# Patient Record
Sex: Female | Born: 1950 | Race: Black or African American | Hispanic: No | Smoking: Never smoker
Health system: Southern US, Community
[De-identification: ages and names within clinical notes are randomized; demographics above are authoritative.]

## PROBLEM LIST (undated history)

## (undated) DIAGNOSIS — H409 Unspecified glaucoma: Secondary | ICD-10-CM

---

## 2006-04-15 ENCOUNTER — Ambulatory Visit: Payer: Self-pay | Admitting: Gastroenterology

## 2006-10-07 ENCOUNTER — Ambulatory Visit: Payer: Self-pay | Admitting: Internal Medicine

## 2006-12-03 ENCOUNTER — Ambulatory Visit: Payer: Self-pay | Admitting: Internal Medicine

## 2007-03-01 ENCOUNTER — Ambulatory Visit: Payer: Self-pay | Admitting: Internal Medicine

## 2008-03-11 IMAGING — US US EXTREM LOW VENOUS*R*
1 series · 17 of 24 positions shown · non-contrast
Comparison: none

REASON FOR EXAM: pain at touch right leg R/O DVT call Report  [id number]
COMMENTS:

PROCEDURE:     US  - US DOPPLER LOW EXTR RIGHT  - March 01, 2007  [DATE]
RESULT:     The phasic, augmentation and Valsalva flow waveforms are normal.
The RIGHT femoral and popliteal veins show normal compressibility. Doppler
examination shows no deep vein thrombosis or occlusion.

[Series 1: us extrem low venous*right* · 17 of 35 slices shown]
[im 1/35]
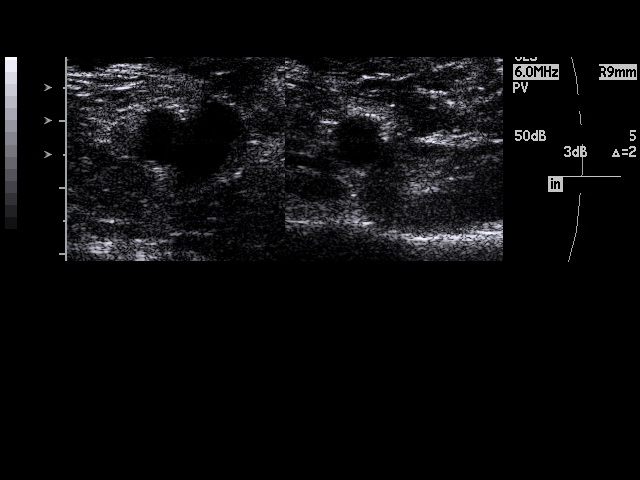
[im 3/35]
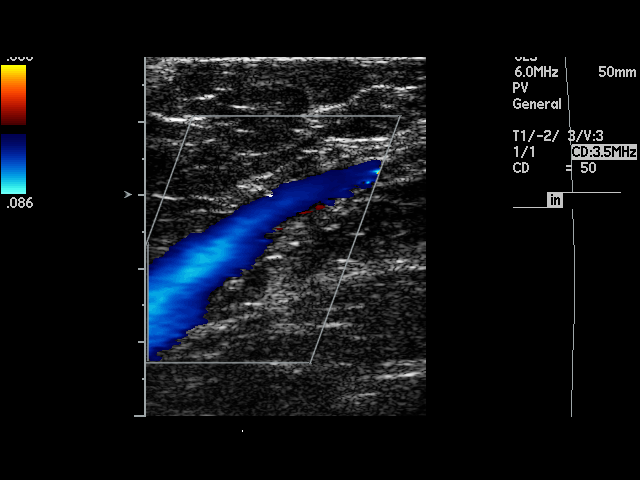
[im 5/35]
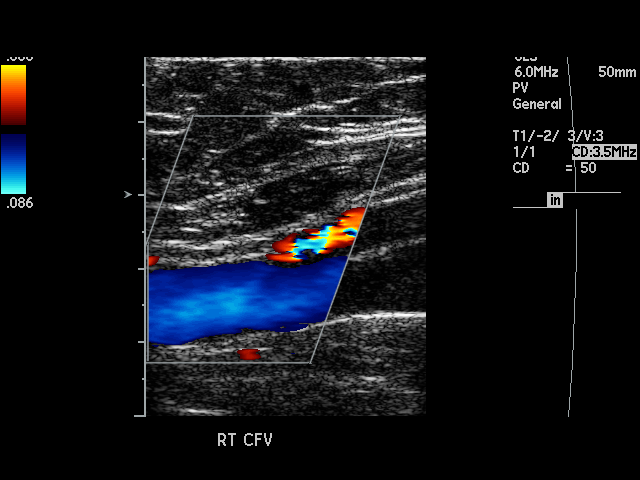
[im 6/35]
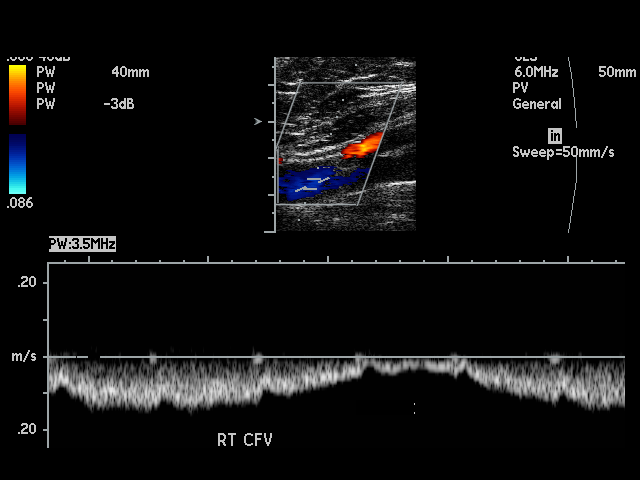
[im 9/35]
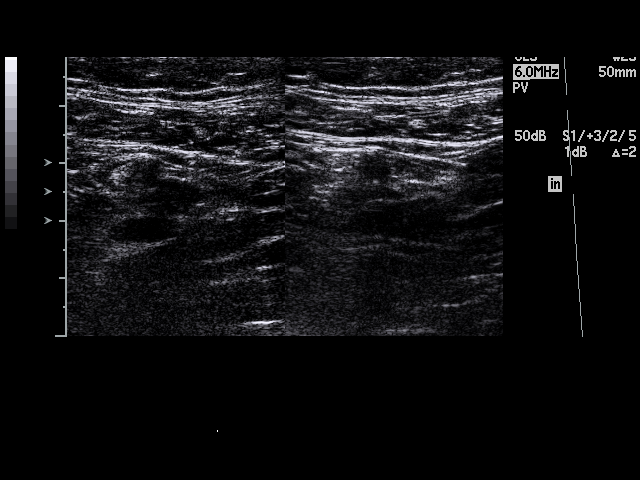
[im 11/35]
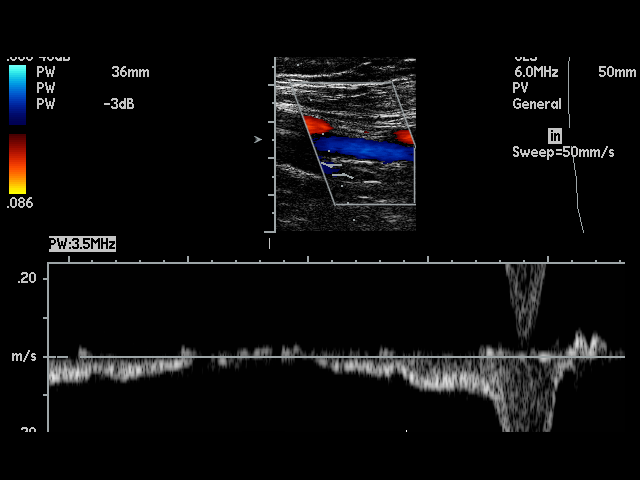
[im 14/35]
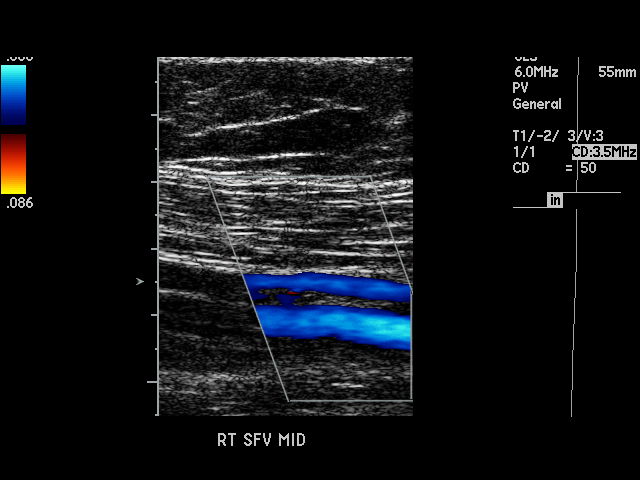
[im 15/35]
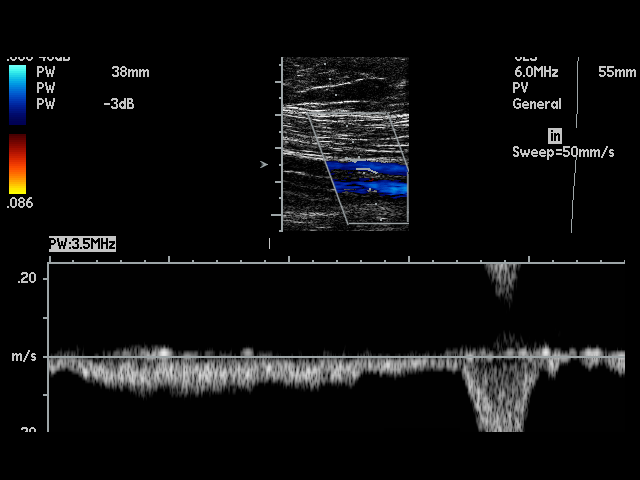
[im 18/35]
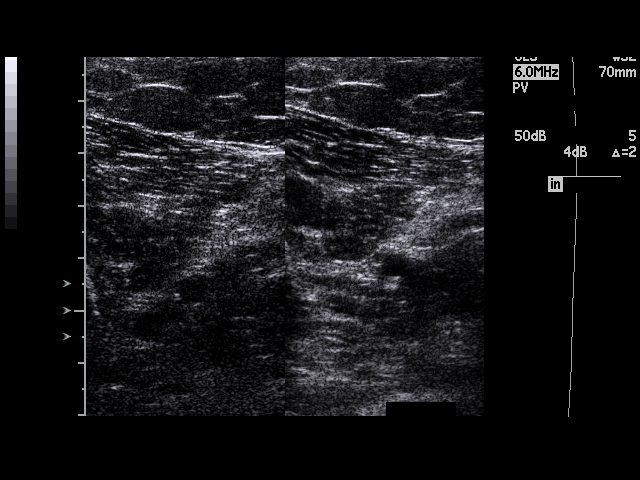
[im 20/35]
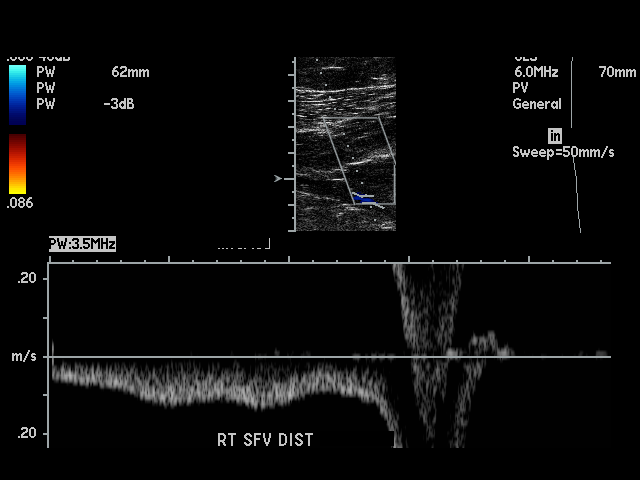
[im 21/35]
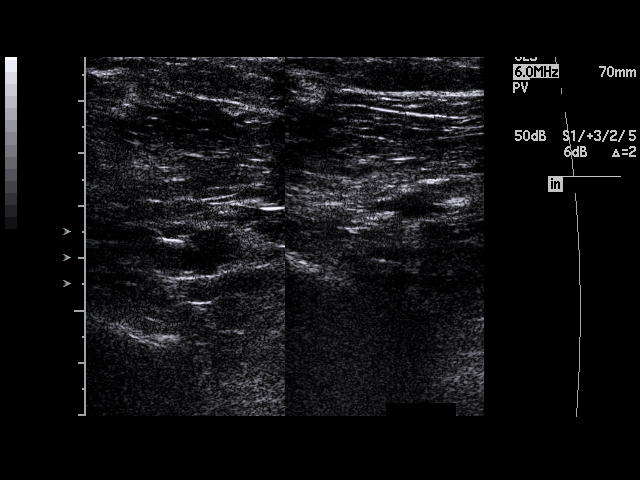
[im 24/35]
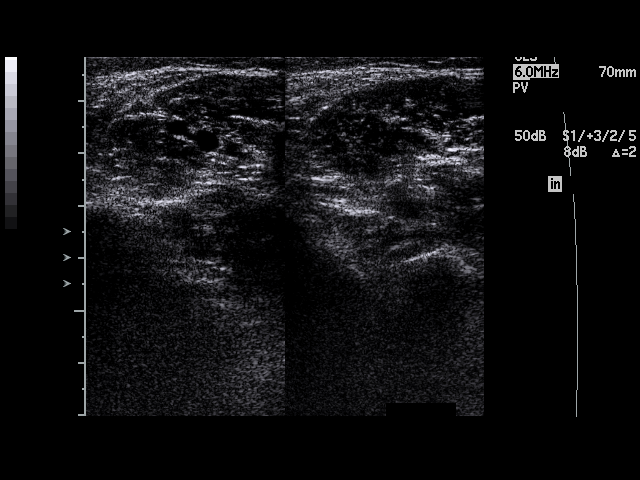
[im 26/35]
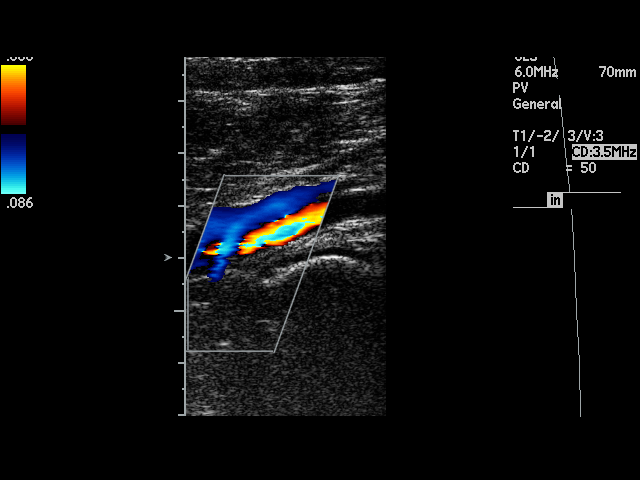
[im 29/35]
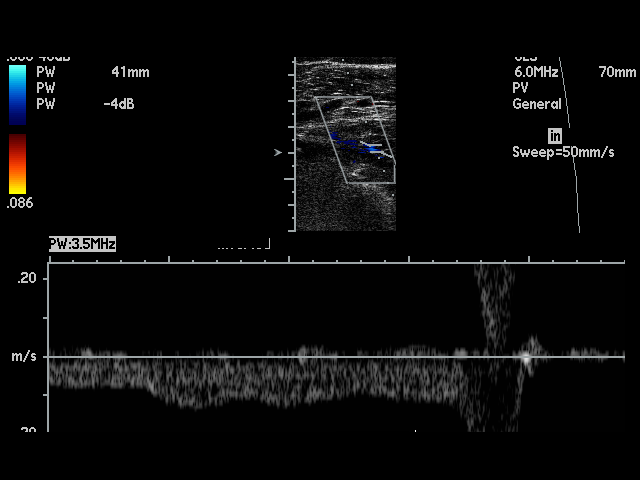
[im 30/35]
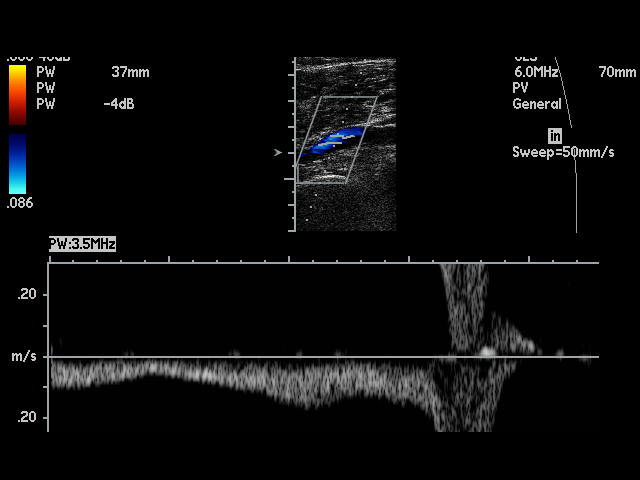
[im 32/35]
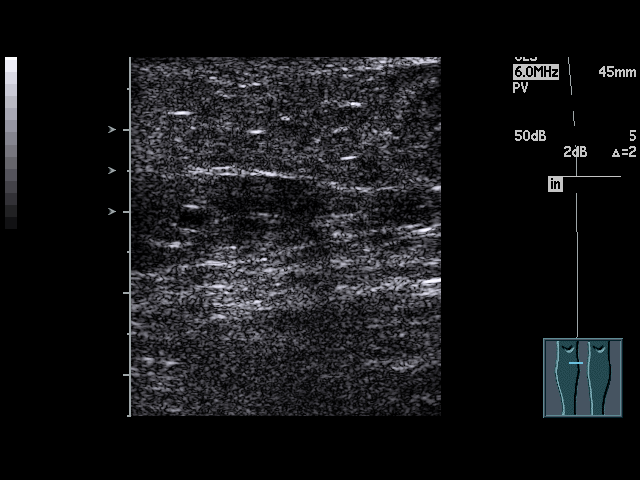
[im 35/35]
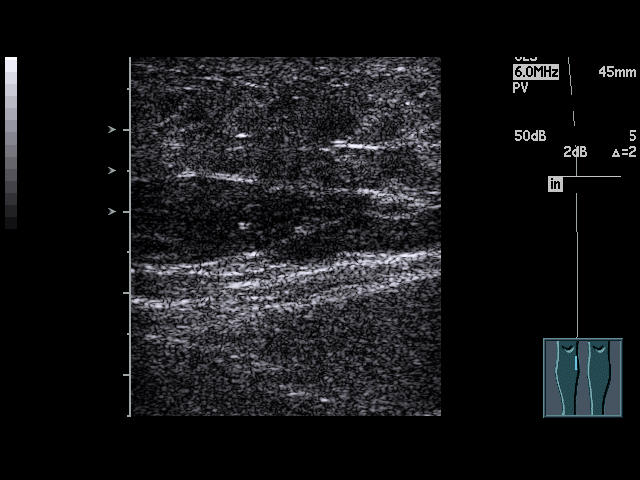

[17 of 24 positions shown; findings below may reference images not displayed]

IMPRESSION: 1.     Normal study. No deep vein thrombosis is identified in the RIGHT
lower extremity.

## 2009-08-22 ENCOUNTER — Ambulatory Visit: Payer: Self-pay | Admitting: Family Medicine

## 2011-05-08 ENCOUNTER — Emergency Department: Payer: Self-pay | Admitting: Emergency Medicine

## 2015-07-04 DIAGNOSIS — H524 Presbyopia: Secondary | ICD-10-CM | POA: Diagnosis not present

## 2018-01-23 ENCOUNTER — Ambulatory Visit
Admission: EM | Admit: 2018-01-23 | Discharge: 2018-01-23 | Disposition: A | Discharge status: Home / Self Care | Payer: Medicare Other | Attending: Family Medicine | Admitting: Family Medicine

## 2018-01-23 ENCOUNTER — Other Ambulatory Visit: Payer: Self-pay

## 2018-01-23 ENCOUNTER — Encounter: Payer: Self-pay | Admitting: Emergency Medicine

## 2018-01-23 DIAGNOSIS — L259 Unspecified contact dermatitis, unspecified cause: Secondary | ICD-10-CM | POA: Diagnosis not present

## 2018-01-23 DIAGNOSIS — R030 Elevated blood-pressure reading, without diagnosis of hypertension: Secondary | ICD-10-CM

## 2018-01-23 HISTORY — DX: Unspecified glaucoma: H40.9

## 2018-01-23 MED ORDER — TRIAMCINOLONE ACETONIDE 0.1 % EX CREA: 1.0000 "application " | TOPICAL_CREAM | Freq: Two times a day (BID) | CUTANEOUS | 0 refills | Status: DC

## 2018-01-23 MED ORDER — PREDNISONE 20 MG PO TABS: ORAL_TABLET | ORAL | 0 refills | Status: DC

## 2018-01-23 NOTE — ED Provider Notes (Addendum)
[facility]    CSN: [id number] Arrival date & time: 01/23/18  7829     History   Chief Complaint Chief Complaint  Patient presents with  . Rash    HPI Emily Hunt is a 67 y.o. female.   67 yo female with a c/o red, itchy rash around her neck for 2 weeks, since having her hair cut. States they used a razor on her neck as part of the hair cut. Patient denies any pain, fevers, chills, drainage. Only intense itching and has tried over the counter meds without relief.   The history is provided by the patient.  Rash    Past Medical History:  Diagnosis Date  . Glaucoma     There are no active problems to display for this patient.   History reviewed. No pertinent surgical history.  OB History   None      Home Medications    Prior to Admission medications   Medication Sig Start Date End Date Taking? Authorizing Provider  latanoprost (XALATAN) 0.005 % ophthalmic solution  12/29/17  Yes [provider]  Multiple Vitamin (MULTIVITAMIN) tablet Take 1 tablet by mouth daily.   Yes [provider]  timolol (TIMOPTIC) 0.5 % ophthalmic solution INT 1 GTT IN OU QD 12/29/17  Yes [provider]  predniSONE (DELTASONE) 20 MG tablet 2 tabs po qd x 3 days, then 1 tab po qd x 4 days 01/23/18   Payton Mccallum, MD  triamcinolone cream (KENALOG) 0.1 % Apply 1 application topically 2 (two) times daily. 01/23/18   Payton Mccallum, MD    Family History History reviewed. No pertinent family history.  Social History Social History   Tobacco Use  . Smoking status: Never Smoker  . Smokeless tobacco: Never Used  Substance Use Topics  . Alcohol use: Never    Frequency: Never  . Drug use: Not on file     Allergies   Sulfa antibiotics   Review of Systems Review of Systems  Skin: Positive for rash.     Physical Exam Triage Vital Signs ED Triage Vitals  Enc Vitals Group     BP 01/23/18 0838 (S) (!) 196/88     Pulse Rate 01/23/18 0838 65   Resp 01/23/18 0838 16     Temp 01/23/18 0838 98.7 F (37.1 C)     Temp Source 01/23/18 0838 Oral     SpO2 01/23/18 0838 99 %     Weight 01/23/18 0834 160 lb (72.6 kg)     Height 01/23/18 0834  (1.549 m)     Head Circumference --      Peak Flow --      Pain Score 01/23/18 0834 0     Pain Loc --      Pain Edu? --      Excl. in GC? --    No data found.  Updated Vital Signs BP (!) 160/90 (BP Location: Left Arm)   Pulse 65   Temp 98.7 F (37.1 C) (Oral)   Resp 16   Ht  (1.549 m)   Wt 160 lb (72.6 kg)   SpO2 99%   BMI 30.23 kg/m   Visual Acuity Right Eye Distance:   Left Eye Distance:   Bilateral Distance:    Right Eye Near:   Left Eye Near:    Bilateral Near:     Physical Exam  Constitutional: She appears well-developed and well-nourished. No distress.  Skin: Rash noted. Rash is maculopapular. She  is not diaphoretic. There is erythema.     Nursing note and vitals reviewed.    UC Treatments / Results  Labs (all labs ordered are listed, but only abnormal results are displayed) Labs Reviewed - No data to display  EKG None  Radiology No results found.  Procedures Procedures (including critical care time)  Medications Ordered in UC Medications - No data to display  Initial Impression / Assessment and Plan / UC Course  I have reviewed the triage vital signs and the nursing notes.  Pertinent labs & imaging results that were available during my care of the patient were reviewed by me and considered in my medical decision making (see chart for details).      Final Clinical Impressions(s) / UC Diagnoses   Final diagnoses:  Contact dermatitis, unspecified contact dermatitis type, unspecified trigger  Single episode of elevated blood pressure     Discharge Instructions     Claritin or Zyrtec one a day Benadryl if needed    ED Prescriptions    Medication Sig Dispense Auth. Provider   predniSONE (DELTASONE) 20 MG tablet 2 tabs po qd x 3  days, then 1 tab po qd x 4 days 10 tablet Mihika Surrette, MD   triamcinolone cream (KENALOG) 0.1 % Apply 1 application topically 2 (two) times daily. 30 g Payton Mccallum, MD      1. diagnosis reviewed with patient 2. rx as per orders above; reviewed possible side effects, interactions, risks and benefits  3. Recommend supportive treatment with otc antihistamines 4. Discussed with the patient the issue of elevated blood pressure and recommendation to follow up with PCP ASAP 5. Follow-up prn if symptoms worsen or don't improve  Controlled Substance Prescriptions [place] Controlled Substance Registry consulted? Not Applicable   Payton Mccallum, MD 01/23/18 4098    Payton Mccallum, MD 01/23/18 [id number]

## 2018-01-23 NOTE — ED Triage Notes (Signed)
Patient c/o red itchy rash around her neck that started couple of weeks ago.

## 2018-01-23 NOTE — Discharge Instructions (Addendum)
Claritin or Zyrtec one a day Benadryl if needed

## 2019-03-01 ENCOUNTER — Other Ambulatory Visit: Payer: Self-pay

## 2019-03-01 ENCOUNTER — Encounter: Payer: Self-pay | Admitting: Emergency Medicine

## 2019-03-01 ENCOUNTER — Ambulatory Visit
Admission: EM | Admit: 2019-03-01 | Discharge: 2019-03-01 | Disposition: A | Discharge status: Home / Self Care | Payer: Medicare Other | Attending: Family Medicine | Admitting: Family Medicine

## 2019-03-01 DIAGNOSIS — H938X3 Other specified disorders of ear, bilateral: Secondary | ICD-10-CM | POA: Diagnosis not present

## 2019-03-01 DIAGNOSIS — R0982 Postnasal drip: Secondary | ICD-10-CM | POA: Diagnosis not present

## 2019-03-01 MED ORDER — IPRATROPIUM BROMIDE 0.06 % NA SOLN: 2.0000 | Freq: Four times a day (QID) | NASAL | 0 refills | Status: DC | PRN

## 2019-03-01 MED ORDER — CETIRIZINE HCL 5 MG PO TABS: 5.0000 mg | ORAL_TABLET | Freq: Every day | ORAL | 1 refills | Status: DC

## 2019-03-01 NOTE — Discharge Instructions (Signed)
Medications as prescribed. ° °Take care ° °Dr. Nkosi Cortright  °

## 2019-03-01 NOTE — ED Provider Notes (Signed)
[facility]    CSN: [id number] Arrival date & time: 03/01/19  [id number]  History   Chief Complaint Chief Complaint  Patient presents with  . Nasal Congestion  . Ear Fullness   HPI  68 year old female presents with the above complaints.  Patient states that she has had symptoms for the past week.  She reports postnasal drip and ear fullness.  No fever.  No reported sick contacts.  She has been taking Allegra without resolution.  No known inciting factor.  No known exacerbating or relieving factors.  No other reported symptoms.  No other complaints.  History reviewed as below. Past Medical History:  Diagnosis Date  . Glaucoma    Home Medications    Prior to Admission medications   Medication Sig Start Date End Date Taking? Authorizing Provider  latanoprost (XALATAN) 0.005 % ophthalmic solution  12/29/17  Yes [provider]  Multiple Vitamin (MULTIVITAMIN) tablet Take 1 tablet by mouth daily.   Yes [provider]  timolol (TIMOPTIC) 0.5 % ophthalmic solution INT 1 GTT IN OU QD 12/29/17  Yes [provider]  triamcinolone cream (KENALOG) 0.1 % Apply 1 application topically 2 (two) times daily. 01/23/18  Yes Payton Mccallumonty, [place], MD  cetirizine (ZYRTEC) 5 MG tablet Take 1 tablet (5 mg total) by mouth daily. 03/01/19   Everlene Otherook, Keyanah Kozicki G, DO  ipratropium (ATROVENT) 0.06 % nasal spray Place 2 sprays into both nostrils 4 (four) times daily as needed for rhinitis. 03/01/19   Tommie Samsook, Juel Ripley G, DO   Social History Social History   Tobacco Use  . Smoking status: Never Smoker  . Smokeless tobacco: Never Used  Substance Use Topics  . Alcohol use: Never    Frequency: Never  . Drug use: Not on file   Allergies   Sulfa antibiotics   Review of Systems Review of Systems  Constitutional: Negative.   HENT: Positive for postnasal drip.        Ear fullness.   Physical Exam Triage Vital Signs ED Triage Vitals  Enc Vitals Group     BP 03/01/19 0846 (!) 190/120     Pulse  Rate 03/01/19 0844 72     Resp 03/01/19 0844 18     Temp 03/01/19 0844 98.9 F (37.2 C)     Temp Source 03/01/19 0844 Oral     SpO2 03/01/19 0844 100 %     Weight 03/01/19 0844 160 lb (72.6 kg)     Height 03/01/19 0844 5' (1.524 m)     Head Circumference --      Peak Flow --      Pain Score 03/01/19 0844 0     Pain Loc --      Pain Edu? --      Excl. in GC? --    Updated Vital Signs BP (!) 190/120 (BP Location: Left Arm)   Pulse 72   Temp 98.9 F (37.2 C) (Oral)   Resp 18   Ht 5' (1.524 m)   Wt 72.6 kg   SpO2 100%   BMI 31.25 kg/m   Visual Acuity Right Eye Distance:   Left Eye Distance:   Bilateral Distance:    Right Eye Near:   Left Eye Near:    Bilateral Near:     Physical Exam Vitals signs and nursing note reviewed.  Constitutional:      General: She is not in acute distress.    Appearance: Normal appearance.  HENT:     Head: Normocephalic and atraumatic.  Right Ear: Tympanic membrane normal.     Left Ear: Tympanic membrane normal.     Mouth/Throat:     Pharynx: Oropharynx is clear. No oropharyngeal exudate or posterior oropharyngeal erythema.  Eyes:     General:        Right eye: No discharge.        Left eye: No discharge.     Conjunctiva/sclera: Conjunctivae normal.  Cardiovascular:     Rate and Rhythm: Normal rate and regular rhythm.  Pulmonary:     Effort: Pulmonary effort is normal. No respiratory distress.  Neurological:     Mental Status: She is alert.  Psychiatric:        Mood and Affect: Mood normal.        Behavior: Behavior normal.    UC Treatments / Results  Labs (all labs ordered are listed, but only abnormal results are displayed) Labs Reviewed - No data to display  EKG None  Radiology No results found.  Procedures Procedures (including critical care time)  Medications Ordered in UC Medications - No data to display  Initial Impression / Assessment and Plan / UC Course  I have reviewed the triage vital signs and the  nursing notes.  Pertinent labs & imaging results that were available during my care of the patient were reviewed by me and considered in my medical decision making (see chart for details).    68 year old female presents with postnasal drip and ear fullness.  Exam unrevealing.  Likely secondary to allergies.  Placing on Zyrtec and Atrovent nasal spray.  Final Clinical Impressions(s) / UC Diagnoses   Final diagnoses:  Post-nasal drip  Sensation of fullness in both ears     Discharge Instructions     Medications as prescribed.  Take care  Dr. Lacinda Axon    ED Prescriptions    Medication Sig Dispense Auth. Provider   cetirizine (ZYRTEC) 5 MG tablet Take 1 tablet (5 mg total) by mouth daily. 30 tablet Kacia Halley G, DO   ipratropium (ATROVENT) 0.06 % nasal spray Place 2 sprays into both nostrils 4 (four) times daily as needed for rhinitis. 15 mL Coral Spikes, DO     Controlled Substance Prescriptions [place] Controlled Substance Registry consulted? Not Applicable   Coral Spikes, [place] 03/01/19 0947

## 2019-03-01 NOTE — ED Triage Notes (Signed)
Patient c/o nasal drainage and ear fullness that started 1 week ago. She states she has been taking OTC Allegra.

## 2022-07-14 DIAGNOSIS — E119 Type 2 diabetes mellitus without complications: Secondary | ICD-10-CM | POA: Diagnosis not present

## 2022-07-14 DIAGNOSIS — H401132 Primary open-angle glaucoma, bilateral, moderate stage: Secondary | ICD-10-CM | POA: Diagnosis not present

## 2022-08-12 ENCOUNTER — Ambulatory Visit (INDEPENDENT_AMBULATORY_CARE_PROVIDER_SITE_OTHER): Payer: Medicare Other | Admitting: Family Medicine

## 2022-08-12 ENCOUNTER — Other Ambulatory Visit: Payer: Self-pay | Admitting: Family Medicine

## 2022-08-12 ENCOUNTER — Encounter: Payer: Self-pay | Admitting: Family Medicine

## 2022-08-12 VITALS — BP 160/90 | HR 71 | Ht 61.0 in | Wt 166.2 lb

## 2022-08-12 DIAGNOSIS — E1169 Type 2 diabetes mellitus with other specified complication: Secondary | ICD-10-CM | POA: Insufficient documentation

## 2022-08-12 DIAGNOSIS — I10 Essential (primary) hypertension: Secondary | ICD-10-CM | POA: Diagnosis not present

## 2022-08-12 DIAGNOSIS — E119 Type 2 diabetes mellitus without complications: Secondary | ICD-10-CM | POA: Insufficient documentation

## 2022-08-12 DIAGNOSIS — Z7689 Persons encountering health services in other specified circumstances: Secondary | ICD-10-CM | POA: Diagnosis not present

## 2022-08-12 MED ORDER — LISINOPRIL-HYDROCHLOROTHIAZIDE 20-12.5 MG PO TABS: 1.0000 | ORAL_TABLET | Freq: Every day | ORAL | 2 refills | Status: DC

## 2022-08-12 NOTE — Progress Notes (Signed)
Subjective:    Patient ID: Emily Hunt, female    DOB: Nov 21, 1950, 71 y.o.   MRN: [id number]  Emily Hunt is a 71 y.o. female presenting on 08/12/2022 for Establish Care  Previous PCP Dr Marolyn Haller [facility]. Not covered on insurance.  HPI  CHRONIC HTN: Reports issue with anxiety and stress, since COVID she had not been seeing a PCP regularly and issue with insurance network. Prior to COVID she was treated only with lifestyle modifications, diet and exercise, no medication. She had controlled BP. Since COVID she has not been as active and diet not adhered to.  Current Meds - None Previously on Lisinopril HCTZ   Not on medications currently. Improved and taken off med before.  Lifestyle: - Diet: Admits previously higher sodium salt, her goal is to limit this more. - Exercise: Walking and gym treadmill regularly, summertime would water aerobics Admits occasional RLE mild ankle edema. Denies CP, dyspnea, HA, dizziness / lightheadedness   CHRONIC DM, Type 2: Reports concerns, unsure sugar range Not checking CBG Meds: None [facility] Dr Inez Pilgrim Denies hypoglycemia, polyuria, visual changes, numbness or tingling.   Health Maintenance: Review all upcoming to do list health maintenance at next apt and follow-up annual     08/12/2022   10:14 AM  Depression screen PHQ 2/9  Decreased Interest 0  Down, Depressed, Hopeless 0  PHQ - 2 Score 0  Altered sleeping 0  Tired, decreased energy 0  Change in appetite 0  Feeling bad or failure about yourself  0  Trouble concentrating 0  Moving slowly or fidgety/restless 0  Suicidal thoughts 0  PHQ-9 Score 0  Difficult doing work/chores Not difficult at all    Past Medical History:  Diagnosis Date   Glaucoma    History reviewed. No pertinent surgical history. Social History   Socioeconomic History   Marital status: Widowed    Spouse name: Not on file   Number of children: Not on file   Years of  education: Not on file   Highest education level: Not on file  Occupational History   Not on file  Tobacco Use   Smoking status: Never   Smokeless tobacco: Never  Vaping Use   Vaping Use: Never used  Substance and Sexual Activity   Alcohol use: Never   Drug use: Not on file   Sexual activity: Not on file  Other Topics Concern   Not on file  Social History Narrative   Not on file   Social Determinants of Health   Financial Resource Strain: Not on file  Food Insecurity: Not on file  Transportation Needs: Not on file  Physical Activity: Not on file  Stress: Not on file  Social Connections: Not on file  Intimate Partner Violence: Not on file   History reviewed. No pertinent family history. Current Outpatient Medications on File Prior to Visit  Medication Sig   b complex vitamins capsule Take 1 capsule by mouth daily.   cetirizine (ZYRTEC) 5 MG chewable tablet Chew 5 mg by mouth daily.   ipratropium (ATROVENT) 0.06 % nasal spray Place 2 sprays into both nostrils 4 (four) times daily as needed for rhinitis.   Multiple Vitamin (MULTIVITAMIN) tablet Take 1 tablet by mouth daily.   latanoprost (XALATAN) 0.005 % ophthalmic solution  (Patient not taking: Reported on 08/12/2022)   timolol (TIMOPTIC) 0.5 % ophthalmic solution INT 1 GTT IN OU QD (Patient not taking: Reported on 08/12/2022)   triamcinolone cream (KENALOG) 0.1 %  Apply 1 application topically 2 (two) times daily. (Patient not taking: Reported on 08/12/2022)   No current facility-administered medications on file prior to visit.    Review of Systems Per HPI unless specifically indicated above      Objective:    BP (!) 160/90 (BP Location: Left Arm, Cuff Size: Large)   Pulse 71   Ht 5\' 1"  (1.549 m)   Wt 166 lb 3.2 oz (75.4 kg)   SpO2 100%   BMI 31.40 kg/m   Wt Readings from Last 3 Encounters:  08/12/22 166 lb 3.2 oz (75.4 kg)  03/01/19 160 lb (72.6 kg)  01/23/18 160 lb (72.6 kg)    Physical Exam Vitals and  nursing note reviewed.  Constitutional:      General: She is not in acute distress.    Appearance: Normal appearance. She is well-developed. She is obese. She is not diaphoretic.     Comments: Well-appearing, comfortable, cooperative  HENT:     Head: Normocephalic and atraumatic.  Eyes:     General:        Right eye: No discharge.        Left eye: No discharge.     Conjunctiva/sclera: Conjunctivae normal.  Cardiovascular:     Rate and Rhythm: Normal rate.  Pulmonary:     Effort: Pulmonary effort is normal.  Skin:    General: Skin is warm and dry.     Findings: No erythema or rash.  Neurological:     Mental Status: She is alert and oriented to person, place, and time.  Psychiatric:        Mood and Affect: Mood normal.        Behavior: Behavior normal.        Thought Content: Thought content normal.     Comments: Well groomed, good eye contact, normal speech and thoughts    No results found for this or any previous visit.    Assessment & Plan:   Problem List Items Addressed This Visit     Diabetes mellitus ([place])   Relevant Medications   lisinopril-hydrochlorothiazide (ZESTORETIC) 20-12.5 MG tablet   Essential hypertension - Primary   Relevant Medications   lisinopril-hydrochlorothiazide (ZESTORETIC) 20-12.5 MG tablet   Other Visit Diagnoses     Encounter to establish care with new doctor           Establish care  HYPERTENSION Uncontrolled Not on medication. Review outside records Seem chronic HBP without successful management, previously came off medication Past on Lisinopril, HCTZ We agreed to resume this therapy today Check chemistry lab in 4 weeks and f/u BP reading in office. Review outside readings if possible. Goal to improve lifestyle again similar to pre covid situation.  Type 2 Diabetes Other complications, obesity, HYPERTENSION Awaiting further work up / testing / screening Yearly DIABETES eye from [facility] f/u results Labs A1c due at  next visit Goal to improve lifestyle diet regimen and exercise Will adjust plan according to upcoming A1c and offer available medication options.  Referral for clinical pharmacy involvement for longstanding history uncontrolled HYPERTENSION, limited medications from previous PCP, now re-established care after COVID, not checking BP regularly, history of diabetes not monitoring and not on medication. Needs assistance with monitoring, medication, management surveillance on these conditions. May warrant CCM in future if additional needs identified.  Orders Placed This Encounter  Procedures   AMB Referral to Pharmacy Medication Management    Referral Priority:   Routine    Referral Type:   Consultation  Referral Reason:   Pharmacy Medication Management    Number of Visits Requested:   1     Meds ordered this encounter  Medications   lisinopril-hydrochlorothiazide (ZESTORETIC) 20-12.5 MG tablet    Sig: Take 1 tablet by mouth daily.    Dispense:  30 tablet    Refill:  2      Follow up plan: Return in about 4 weeks (around 09/09/2022) for 4 weeks follow-up non fasting lab and 2-3 days later Follow-up with me HTN, DM.  Future labs 09/09/22 A1c BMET  Nobie Putnam, [facility] 08/12/2022, 10:20 AM

## 2022-08-12 NOTE — Patient Instructions (Addendum)
Thank you for coming to the office today.  BP reading was 160/90 New BP medication today - Lisinopril-Hydrochlorothiazide 20-12.5mg  once daily.  We will check on sugar next time.  I have referred you to [facility] clinical pharmacist, she can call you sometime in next 1-2 weeks about the blood pressure.  DUE for NON FASTING BLOOD WORK   SCHEDULE "Lab Only" visit in the morning at the clinic for lab draw in 4 WEEKS   - Make sure Lab Only appointment is at about 1 week before your next appointment, so that results will be available  For Lab Results, once available within 2-3 days of blood draw, you can can log in to MyChart online to view your results and a brief explanation. Also, we can discuss results at next follow-up visit.   Please schedule a Follow-up Appointment to: Return in about 4 weeks (around 09/09/2022) for 4 weeks follow-up non fasting lab and 2-3 days later Follow-up with me HTN, DM.  If you have any other questions or concerns, please feel free to call the office or send a message through MyChart. You may also schedule an earlier appointment if necessary.  Additionally, you may be receiving a survey about your experience at our office within a few days to 1 week by e-mail or mail. We value your feedback.  Saralyn Pilar, DO [facility], [place]

## 2022-08-13 ENCOUNTER — Telehealth: Payer: Self-pay

## 2022-08-13 NOTE — Progress Notes (Signed)
  Care Coordination  Note  08/13/2022 Name: [name] MRN: [id number] DOB: 03-07-1951  Emily Hunt is a 71 y.o. year old female who is a primary care patient of [place], Loraine Leriche, MD. I reached out to [employer] by phone today to offer care coordination services.      [name] was given information about Care Coordination services today including:  The Care Coordination services include support from the care team which includes your Nurse Coordinator, Clinical Social Worker, or Pharmacist.  The Care Coordination team is here to help remove barriers to the health concerns and goals most important to you. Care Coordination services are voluntary and the patient may decline or stop services at any time by request to their care team member.   Patient agreed to services and verbal consent obtained.   Follow up plan: Telephone appointment with care coordination team member scheduled for:09/08/2022  [name], RMA Care Guide [facility]  [place], [place] [address] Direct Dial: [phone] [email or web address]

## 2022-09-08 ENCOUNTER — Other Ambulatory Visit: Payer: Self-pay

## 2022-09-08 ENCOUNTER — Ambulatory Visit: Payer: Medicare Other | Admitting: Pharmacist

## 2022-09-08 DIAGNOSIS — I10 Essential (primary) hypertension: Secondary | ICD-10-CM

## 2022-09-08 DIAGNOSIS — E1169 Type 2 diabetes mellitus with other specified complication: Secondary | ICD-10-CM

## 2022-09-08 NOTE — Patient Instructions (Signed)
Goals Addressed             This Visit's Progress    Pharmacy Goals       Check your blood pressure once daily, and any time you have concerning symptoms like headache, chest pain, dizziness, shortness of breath, or vision changes.   Our goal is less than 130/80.  To appropriately check your blood pressure, make sure you do the following:  1) Avoid caffeine, exercise, or tobacco products for 30 minutes before checking. Empty your bladder. 2) Sit with your back supported in a flat-backed chair. Rest your arm on something flat (arm of the chair, table, etc). 3) Sit still with your feet flat on the floor, resting, for at least 5 minutes.  4) Check your blood pressure. Take 1-2 readings.  5) Write down these readings and bring with you to any provider appointments.  Bring your home blood pressure machine with you to a provider's office for accuracy comparison at least once a year.   Make sure you take your blood pressure medications before you come to any office visit, even if you were asked to fast for labs.  Hadrian Yarbrough Jace Fermin, PharmD, BCACP, CPP Clinical Pharmacist [facility] [place] [phone]         

## 2022-09-08 NOTE — Chronic Care Management (AMB) (Signed)
Chronic Care Management   Outreach Note  09/08/2022 Name: Emily Hunt MRN: [id number] DOB: July 08, 1951  I connected with Emily Hunt on 09/08/22 by telephone outreach and verified that I am speaking with the correct person using two identifiers.  Patient appearing on report for [place] Metric Hypertension Control due to last documented ambulatory blood pressure of 160/90 on 08/12/2022. Next appointment with PCP is 09/12/2022   Outreached patient to discuss hypertension control and medication management.   Outpatient Encounter Medications as of 09/08/2022  Medication Sig   b complex vitamins capsule Take 1 capsule by mouth daily.   dorzolamide-timolol (COSOPT) 2-0.5 % ophthalmic solution 1 drop 2 (two) times daily.   fexofenadine (ALLEGRA) 180 MG tablet Take 180 mg by mouth daily.   latanoprost (XALATAN) 0.005 % ophthalmic solution Place 1 drop into both eyes at bedtime.   lisinopril-hydrochlorothiazide (ZESTORETIC) 20-12.5 MG tablet Take 1 tablet by mouth daily.   Multiple Vitamin (MULTIVITAMIN) tablet Take 1 tablet by mouth daily.   [DISCONTINUED] cetirizine (ZYRTEC) 5 MG chewable tablet Chew 5 mg by mouth daily as needed.   [DISCONTINUED] ipratropium (ATROVENT) 0.06 % nasal spray Place 2 sprays into both nostrils 4 (four) times daily as needed for rhinitis. (Patient not taking: Reported on 09/08/2022)   [DISCONTINUED] timolol (TIMOPTIC) 0.5 % ophthalmic solution INT 1 GTT IN OU QD (Patient not taking: Reported on 08/12/2022)   [DISCONTINUED] triamcinolone cream (KENALOG) 0.1 % Apply 1 application topically 2 (two) times daily. (Patient not taking: Reported on 08/12/2022)   No facility-administered encounter medications on file as of 09/08/2022.    No results found for: "CREATININE", "BUN", "NA", "K", "CL", "CO2"  BP Readings from Last 3 Encounters:  08/12/22 (!) 160/90  03/01/19 (!) 190/120  01/23/18 (!) 160/90    Pulse Readings from Last 3 Encounters:  08/12/22 71   03/01/19 72  01/23/18 65    Current medications: lisinopril-HCTZ 20-12.5 mg every morning Denies missed doses  Home Monitoring: Patient does not have an automated upper arm home BP machine Reports has been checking blood pressure using a wrist monitor from her friend, but reports is not confident about accuracy of the readings (today: 147/99, HR 69; 12/15: 137/95, HR 78)  Caffeine: rarely drinks caffeine   Reports limits salt/sodium in her diet. Uses salt-free seasoning  Current physical activity: walking in [employer], but not recently since cold  Assessment/Plan: - Currently uncontrolled - Reviewed appropriate administration of medication regimen - Counseled on long term microvascular and macrovascular complications of uncontrolled hypertension - Encourage patient to obtain upper arm blood pressure monitor either from over the counter benefit (OTC) of or local pharmacy.  Discuss importance of measuring arm to ensure obtains correct cuff size for accuracy of readings - Reviewed appropriate home BP monitoring technique (avoid caffeine, smoking, and exercise for 30 minutes before checking, rest for at least 5 minutes before taking BP, sit with feet flat on the floor and back against a hard surface, uncross legs, and rest arm on flat surface) - Reviewed to check blood pressure, document, and provide at next provider visit - Discussed dietary modifications, such as reduced salt intake, focus on whole grains, vegetables, lean proteins - Reviewed strategies to improve medication adherence such as using weekly pillbox - Encourage patient to increase her physical activity. Reports will start home Silver Sneakers exercise videos again   Follow Up Plan: CM Pharmacist will outreach to patient by telephone again on 10/13/2022 at 9:15 am  Estelle Grumbles, PharmD, [facility]  Clinical Pharmacist [facility] [phone]

## 2022-09-09 ENCOUNTER — Other Ambulatory Visit: Payer: Medicare Other

## 2022-09-09 DIAGNOSIS — I10 Essential (primary) hypertension: Secondary | ICD-10-CM | POA: Diagnosis not present

## 2022-09-09 DIAGNOSIS — E1169 Type 2 diabetes mellitus with other specified complication: Secondary | ICD-10-CM | POA: Diagnosis not present

## 2022-09-10 LAB — BASIC METABOLIC PANEL WITH GFR
BUN: 13 mg/dL (ref 7–25)
CO2: 30 mmol/L (ref 20–32)
Calcium: 10.1 mg/dL (ref 8.6–10.4)
Chloride: 100 mmol/L (ref 98–110)
Creat: 0.79 mg/dL (ref 0.60–1.00)
Glucose, Bld: 108 mg/dL — ABNORMAL HIGH (ref 65–99)
Potassium: 4.6 mmol/L (ref 3.5–5.3)
Sodium: 140 mmol/L (ref 135–146)
eGFR: 79 mL/min/{1.73_m2} (ref >=60)

## 2022-09-10 LAB — HEMOGLOBIN A1C
Hgb A1c MFr Bld: 6.3 % of total Hgb — ABNORMAL HIGH (ref <5.7)
Mean Plasma Glucose: 134 mg/dL
eAG (mmol/L): 7.4 mmol/L

## 2022-09-12 ENCOUNTER — Ambulatory Visit (INDEPENDENT_AMBULATORY_CARE_PROVIDER_SITE_OTHER): Payer: Medicare Other | Admitting: Family Medicine

## 2022-09-12 ENCOUNTER — Other Ambulatory Visit: Payer: Self-pay | Admitting: Family Medicine

## 2022-09-12 VITALS — BP 148/90 | HR 80 | Ht 61.0 in | Wt 164.0 lb

## 2022-09-12 DIAGNOSIS — Z1211 Encounter for screening for malignant neoplasm of colon: Secondary | ICD-10-CM | POA: Diagnosis not present

## 2022-09-12 DIAGNOSIS — E1169 Type 2 diabetes mellitus with other specified complication: Secondary | ICD-10-CM

## 2022-09-12 DIAGNOSIS — I10 Essential (primary) hypertension: Secondary | ICD-10-CM

## 2022-09-12 DIAGNOSIS — E669 Obesity, unspecified: Secondary | ICD-10-CM | POA: Diagnosis not present

## 2022-09-12 DIAGNOSIS — Z0000 Encounter for general adult medical examination without abnormal findings: Secondary | ICD-10-CM

## 2022-09-12 MED ORDER — VALSARTAN-HYDROCHLOROTHIAZIDE 160-12.5 MG PO TABS: 1.0000 | ORAL_TABLET | Freq: Every day | ORAL | 2 refills | Status: DC

## 2022-09-12 NOTE — Assessment & Plan Note (Signed)
Mildly elevated initial BP, repeat manual check improved but still elevated. - Home BP readings limited  No known complications    Plan:  1. ACEi cough likely side effect 2. DC combo Lisinopril-HCTZ 20-12.5 3. Switch to other combo - Valsartan-HCTZ 160-12.5mg  daily 4. Encourage improved lifestyle - low sodium diet, regular exercise 5. Continue monitor BP outside office, bring readings to next visit, if persistently >140/90 or new symptoms notify office sooner

## 2022-09-12 NOTE — Progress Notes (Signed)
Subjective:    Patient ID: Emily Hunt, female    DOB: [birth year], 71 y.o.   MRN: [id number]  Emily Hunt is a 71 y.o. female presenting on 09/12/2022 for Hypertension and elevated a1c   HPI  CHRONIC HTN: PMH Prior to [place] she was treated only with lifestyle modifications, diet and exercise, no medication. She had controlled BP. Since COVID she has not been as active and diet not adhered to.   Current Meds - Lisinopril-HCTZ 20-12.44m She admits cough and throat tickle on med   Lifestyle: - Diet: Goal to limit sodium in diet more - Exercise: Walking and gym treadmill regularly, summertime would water aerobics Admits occasional RLE mild ankle edema. Denies CP, dyspnea, HA, dizziness / lightheadedness    CHRONIC DM, Type 2: A1c 6.3 on last lab. Not checking CBG [facility]: [facility] 2024 She is mostly diet controlled. Less walking now but plans to improve Denies hypoglycemia, polyuria, visual changes, numbness or tingling.    Health Maintenance: Review all upcoming to do list health maintenance at next apt and follow-up annual  Colon CA Screening: NO recent Colon CA Screening, due for Cologuard, will order  Breast CA Screening prior mammogram normal, she will do self checks and monitor     08/12/2022   10:14 AM  Depression screen PHQ 2/9  Decreased Interest 0  Down, Depressed, Hopeless 0  PHQ - 2 Score 0  Altered sleeping 0  Tired, decreased energy 0  Change in appetite 0  Feeling bad or failure about yourself  0  Trouble concentrating 0  Moving slowly or fidgety/restless 0  Suicidal thoughts 0  PHQ-9 Score 0  Difficult doing work/chores Not difficult at all    Social History   Tobacco Use   Smoking status: Never   Smokeless tobacco: Never  Vaping Use   Vaping Use: Never used  Substance Use Topics   Alcohol use: Never    Review of Systems Per HPI unless specifically indicated above     Objective:     BP (!) 148/90 (BP Location: Left Arm, Cuff Size: Normal)   Pulse 80   Ht _0  (1.549 m)   Wt 164 lb (74.4 kg)   SpO2 99%   BMI 30.99 kg/m   Wt Readings from Last 3 Encounters:  09/12/22 164 lb (74.4 kg)  08/12/22 166 lb 3.2 oz (75.4 kg)  03/01/19 160 lb (72.6 kg)    Physical Exam Vitals and nursing note reviewed.  Constitutional:      General: She is not in acute distress.    Appearance: She is well-developed. She is not diaphoretic.     Comments: Well-appearing, comfortable, cooperative  HENT:     Head: Normocephalic and atraumatic.  Eyes:     General:        Right eye: No discharge.        Left eye: No discharge.     Conjunctiva/sclera: Conjunctivae normal.  Neck:     Thyroid: No thyromegaly.  Cardiovascular:     Rate and Rhythm: Normal rate and regular rhythm.     Heart sounds: Normal heart sounds. No murmur heard. Pulmonary:     Effort: Pulmonary effort is normal. No respiratory distress.     Breath sounds: Normal breath sounds. No wheezing or rales.  Musculoskeletal:        General: Normal range of motion.     Cervical back: Normal range of motion and neck  supple.  Lymphadenopathy:     Cervical: No cervical adenopathy.  Skin:    General: Skin is warm and dry.     Findings: No erythema or rash.  Neurological:     Mental Status: She is alert and oriented to person, place, and time.  Psychiatric:        Behavior: Behavior normal.     Comments: Well groomed, good eye contact, normal speech and thoughts     Diabetic Foot Exam - Simple   Simple Foot Form Diabetic Foot exam was performed with the following findings: Yes 09/12/2022 11:06 AM  Visual Inspection No deformities, no ulcerations, no other skin breakdown bilaterally: Yes Sensation Testing Intact to touch and monofilament testing bilaterally: Yes Pulse Check Posterior Tibialis and Dorsalis pulse intact bilaterally: Yes Comments      Results for orders placed or performed in visit on 72/53/66   BASIC METABOLIC PANEL WITH GFR  Result Value Ref Range   Glucose, Bld 108 (H) 65 - 99 mg/dL   BUN 13 7 - 25 mg/dL   Creat 0.79 0.60 - 1.00 mg/dL   eGFR 79 > OR = 60 mL/min/1.11m   BUN/Creatinine Ratio SEE NOTE: 6 - 22 (calc)   Sodium 140 135 - 146 mmol/L   Potassium 4.6 3.5 - 5.3 mmol/L   Chloride 100 98 - 110 mmol/L   CO2 30 20 - 32 mmol/L   Calcium 10.1 8.6 - 10.4 mg/dL  Hemoglobin A1c  Result Value Ref Range   Hgb A1c MFr Bld 6.3 (H) <5.7 % of total Hgb   Mean Plasma Glucose 134 mg/dL   eAG (mmol/L) 7.4 mmol/L      Assessment & Plan:   Problem List Items Addressed This Visit     Essential hypertension    Mildly elevated initial BP, repeat manual check improved but still elevated. - Home BP readings limited  No known complications    Plan:  1. ACEi cough likely side effect 2. DC combo Lisinopril-HCTZ 20-12.5 3. Switch to other combo - Valsartan-HCTZ 160-12.576mdaily 4. Encourage improved lifestyle - low sodium diet, regular exercise 5. Continue monitor BP outside office, bring readings to next visit, if persistently >140/90 or new symptoms notify office sooner       Relevant Medications   valsartan-hydrochlorothiazide (DIOVAN-HCT) 160-12.5 MG tablet   Type 2 diabetes mellitus with other specified complication ([facility]- [facility]    Well-controlled DM with A1Y4I.3 Complications - HYPERTENSION, HLD  Plan:  1. Remain diet controlled, medication free 2. Encourage improved lifestyle - low carb, low sugar diet, reduce portion size, continue improving regular exercise 3. Check CBG , bring log to next visit for review 4. Continue ARB now off ACEi and future statin therapy 5. DM Foot exam done today  REquest copy of DIABETES eye exam       Relevant Medications   valsartan-hydrochlorothiazide (DIOVAN-HCT) 160-12.5 MG tablet   Other Relevant Orders   Urine Microalbumin w/creat. ratio   Other Visit Diagnoses     Screening for colon cancer       Relevant Orders    Cologuard   Obesity (BMI 30.0-34.9)           Orders Placed This Encounter  Procedures   Cologuard   Urine Microalbumin w/creat. ratio     Meds ordered this encounter  Medications   valsartan-hydrochlorothiazide (DIOVAN-HCT) 160-12.5 MG tablet    Sig: Take 1 tablet by mouth daily.    Dispense:  30 tablet  Refill:  2      Follow up plan: Return in about 6 months (around 03/14/2023) for 6 month fasting lab only then 1 week later Annual Physical.  Future labs ordered for 02/2023   Nobie Putnam, [facility] 09/12/2022, 10:51 AM

## 2022-09-12 NOTE — Assessment & Plan Note (Signed)
Well-controlled DM with A1c 6.3 Complications - HYPERTENSION, HLD  Plan:  1. Remain diet controlled, medication free 2. Encourage improved lifestyle - low carb, low sugar diet, reduce portion size, continue improving regular exercise 3. Check CBG , bring log to next visit for review 4. Continue ARB now off ACEi and future statin therapy 5. DM Foot exam done today  REquest copy of DIABETES eye exam

## 2022-09-12 NOTE — Patient Instructions (Addendum)
Thank you for coming to the office today.  Schedule Annual Medicare Wellness on the Phone with one of our nurses.   Please have Dr Wallace Going send [place] a copy of the report in March - our fax # is [phone]  Recent Labs    09/09/22 0759  HGBA1C 6.3*   Diabetes is controlled.  Stop taking Lisinopril-HCTZ 20-12.20m daily now switch to Valsartan-HCTZ 160-12.536mdaily  This may resolve the tickle tingle in throat.  ----------------- Ordered the Cologuard (home kit) test for colon cancer screening. Stay tuned for further updates.  It will be shipped to you directly. If not received in 2-4 weeks, call [employer] the company.   If you send it back and no results are received in 2-4 weeks, call [employer] the company as well!   Colon Cancer Screening: - For all adults age 73+outine colon cancer screening is highly recommended.     - Recent guidelines from [place] age of 73 Early detection of colon cancer is important, because often there are no warning signs or symptoms, also if found early usually it can be cured. Late stage is hard to treat.   - If Cologuard is NEGATIVE, then it is good for 3 years before next due - If Cologuard is POSITIVE, then it is strongly advised to get a Colonoscopy, which allows the GI doctor to locate the source of the cancer or polyp (even very early stage) and treat it by removing it. -------------------------  Follow instructions to collect sample, you may call the company for any help or questions, 24/7 telephone support at [phone] DUE for FASTING BLOOD WORK (no food or drink after midnight before the lab appointment, only water or coffee without cream/sugar on the morning of)  SCHEDULE "Lab Only" visit in the morning at the clinic for lab draw in 6 MONTHS   - Make sure Lab Only appointment is at about 1 week before your next appointment, so that results will be available  For Lab Results, once available within  2-3 days of blood draw, you can can log in to MyChart online to view your results and a brief explanation. Also, we can discuss results at next follow-up visit.   Please schedule a Follow-up Appointment to: Return in about 6 months (around 03/14/2023) for 6 month fasting lab only then 1 week later Annual Physical.  If you have any other questions or concerns, please feel free to call the office or send a message through My[place]waterYou may also schedule an earlier appointment if necessary.  Additionally, you may be receiving a survey about your experience at our office within a few days to 1 week by e-mail or mail. We value your feedback.  AlNobie PutnamDO [employer]

## 2022-09-13 LAB — MICROALBUMIN / CREATININE URINE RATIO
Creatinine, Urine: 27 mg/dL (ref 20–275)
Microalb, Ur: <0.2 mg/dL

## 2022-09-26 DIAGNOSIS — Z1211 Encounter for screening for malignant neoplasm of colon: Secondary | ICD-10-CM | POA: Diagnosis not present

## 2022-10-05 LAB — COLOGUARD: COLOGUARD: NEGATIVE

## 2022-10-13 ENCOUNTER — Ambulatory Visit: Payer: Medicare Other | Admitting: Pharmacist

## 2022-10-13 DIAGNOSIS — I10 Essential (primary) hypertension: Secondary | ICD-10-CM

## 2022-10-13 NOTE — Progress Notes (Signed)
Chronic Care Management   Outreach Note  10/13/2022 Name: Emily Hunt MRN: [id number] DOB: 28-Feb-1951  I connected with Emily Hunt on 10/13/22 by telephone outreach and verified that I am speaking with the correct person using two identifiers.  Patient appearing on report for [place] Metric Hypertension Control due to last documented ambulatory blood pressure of 148/90 on 09/12/2022.   Outreached patient to discuss hypertension control and medication management.   Outpatient Encounter Medications as of 10/13/2022  Medication Sig   fexofenadine (ALLEGRA) 180 MG tablet Take 180 mg by mouth daily.   valsartan-hydrochlorothiazide (DIOVAN-HCT) 160-12.5 MG tablet Take 1 tablet by mouth daily.   b complex vitamins capsule Take 1 capsule by mouth daily.   CALCIUM MAGNESIUM ZINC PO Take by mouth.   dorzolamide-timolol (COSOPT) 2-0.5 % ophthalmic solution 1 drop 2 (two) times daily.   latanoprost (XALATAN) 0.005 % ophthalmic solution Place 1 drop into both eyes at bedtime.   Multiple Vitamin (MULTIVITAMIN) tablet Take 1 tablet by mouth daily.   No facility-administered encounter medications on file as of 10/13/2022.    Lab Results  Component Value Date   CREATININE 0.79 09/09/2022   BUN 13 09/09/2022   NA 140 09/09/2022   K 4.6 09/09/2022   CL 100 09/09/2022   CO2 30 09/09/2022    BP Readings from Last 3 Encounters:  09/12/22 (!) 148/90  08/12/22 (!) 160/90  03/01/19 (!) 190/120    Pulse Readings from Last 3 Encounters:  09/12/22 80  08/12/22 71  03/01/19 72   From review of chart, note patient seen for Office Visit with PCP on 09/12/2022 BP 148/90, HR 80 Provider advised patient: - Stop taking Lisinopril-HCTZ 20-12.5mg  daily now switch to Valsartan-HCTZ 160-12.5mg  daily  - Ordered the Cologuard (home kit) test for colon cancer screening   Today reports that the tickle in her throat has resolved since provider switched her from lisinopril-HCTZ to  valsartan-HCTZ  Reports completed Cologuard as directed   Hypertension:  Current medications: valsartan-HCTZ 160-12.5mg  - 1 tablet daily each morning  Denies missed doses; denies using weekly pillbox, but part of her routine  Previous therapies tried: lisinopril-HCTZ (cough)  Home Monitoring: Patient has an automated upper arm home BP machine Counsel on BP monitoring technique Current blood pressure readings: reports last checked last week, readings: 149/82, HR 78, 163/78, HR 84; patient unable to locate log of previous readings, but recalls results from previous week were higher than these from last week Patient denies hypotensive s/sx including dizziness, lightheadedness. Patient denies hypertensive symptoms including headache, chest pain, shortness of breath  Caffeine: rarely drinks caffeine    Reports limits salt/sodium in her diet. Uses salt-free seasoning   Current physical activity: not walking as much recently since cold   Type 2 Diabetes:  Current medications: none  Current dietary patterns: - Breakfast: oatmeal or egg whites and spinach - Lunch: leftovers from supper (vegetables and chicken breast) - Supper: vegetables and chicken breast or vegetable soup - Drinks: water and some green tea or lemon ginger - Snacks: salt-free nuts (pecans, walnuts, almonds), seeds and dried fruit mix  Current physical activity: not walking as much recently since cold  States preference is to not start medication for blood sugar lowering at this time   Assessment/Plan:  Hypertension: - Currently uncontrolled - Reviewed appropriate administration of medication regimen - Counseled on long term microvascular and macrovascular complications of uncontrolled hypertension - Reviewed appropriate home BP monitoring technique (avoid caffeine and exercise for 30 minutes  before checking, rest for at least 5 minutes before taking BP, sit with feet flat on the floor and back against a hard  surface, uncross legs, and rest arm on flat surface) - Discussed dietary modifications, such as reduced salt intake, focus on whole grains, vegetables, lean proteins - Reviewed strategies to improve medication adherence such as using weekly pillbox - Encourage patient to increase her physical activity. Reports will start home Silver Sneakers exercise videos again - Collaborate with PCP regarding recent home BP readings and medication management Agree will have patient increase dose of Valsartan-HCTZ 160-12.5mg  to 2 tablets (valsartan-HCTZ 320-25 mg total) daily in morning Follow up with patient to discuss dose change for blood pressure control Patient verbalizes understanding via teach back method and states that she will start taking this new dose tomorrow, 1/23 Encourage patient to stay hydrated throughout the day Encourage patient to take positional changes slowly Assist patient with scheduling follow up lab appointment for recheck of BMET in 2 weeks - Counsel patient to check blood pressure, document, have to provide at next visit, but to contact office sooner for readings outside of established parameters or for any dizziness or other symptoms  Diabetes: - Reviewed long term cardiovascular and renal outcomes of uncontrolled blood sugar - Reviewed dietary modifications including having regular well-balanced meals while controlling carbohydrate portion sizes - Encourage patient to increase her physical activity. Reports will start home Silver Sneakers exercise videos again   Follow Up Plan:  1) Patient scheduled for lab appointment for follow up Basic Metabolic Panel lab on 04/25/4195 at 8:15 am 2) CM Pharmacist will outreach to patient by telephone again on 10/31/2022 at [place], PharmD, [place] [phone]

## 2022-10-13 NOTE — Patient Instructions (Signed)
Goals Addressed             This Visit's Progress    Pharmacy Goals       Check your blood pressure once daily, and any time you have concerning symptoms like headache, chest pain, dizziness, shortness of breath, or vision changes.   Our goal is less than 130/80.  To appropriately check your blood pressure, make sure you do the following:  1) Avoid caffeine, exercise, or tobacco products for 30 minutes before checking. Empty your bladder. 2) Sit with your back supported in a flat-backed chair. Rest your arm on something flat (arm of the chair, table, etc). 3) Sit still with your feet flat on the floor, resting, for at least 5 minutes.  4) Check your blood pressure. Take 1-2 readings.  5) Write down these readings and bring with you to any provider appointments.  Bring your home blood pressure machine with you to a provider's office for accuracy comparison at least once a year.   Make sure you take your blood pressure medications before you come to any office visit, even if you were asked to fast for labs.  Brown Dunlap Somaya Grassi, PharmD, BCACP, CPP Clinical Pharmacist [facility] [place] [phone]         

## 2022-10-27 ENCOUNTER — Other Ambulatory Visit: Payer: Self-pay

## 2022-10-27 DIAGNOSIS — Z0000 Encounter for general adult medical examination without abnormal findings: Secondary | ICD-10-CM

## 2022-10-27 DIAGNOSIS — E1169 Type 2 diabetes mellitus with other specified complication: Secondary | ICD-10-CM

## 2022-10-27 DIAGNOSIS — I10 Essential (primary) hypertension: Secondary | ICD-10-CM

## 2022-10-28 ENCOUNTER — Other Ambulatory Visit: Payer: Medicare Other

## 2022-10-28 DIAGNOSIS — I10 Essential (primary) hypertension: Secondary | ICD-10-CM | POA: Diagnosis not present

## 2022-10-28 DIAGNOSIS — E785 Hyperlipidemia, unspecified: Secondary | ICD-10-CM | POA: Diagnosis not present

## 2022-10-28 DIAGNOSIS — E1169 Type 2 diabetes mellitus with other specified complication: Secondary | ICD-10-CM | POA: Diagnosis not present

## 2022-10-28 DIAGNOSIS — Z0000 Encounter for general adult medical examination without abnormal findings: Secondary | ICD-10-CM | POA: Diagnosis not present

## 2022-10-29 LAB — COMPLETE METABOLIC PANEL WITH GFR
AG Ratio: 1.8 (calc) (ref 1.0–2.5)
ALT: 20 U/L (ref 6–29)
AST: 23 U/L (ref 10–35)
Albumin: 4.6 g/dL (ref 3.6–5.1)
Alkaline phosphatase (APISO): 85 U/L (ref 37–153)
BUN: 18 mg/dL (ref 7–25)
CO2: 32 mmol/L (ref 20–32)
Calcium: 9.9 mg/dL (ref 8.6–10.4)
Chloride: 99 mmol/L (ref 98–110)
Creat: 0.69 mg/dL (ref 0.60–1.00)
Globulin: 2.6 g/dL (calc) (ref 1.9–3.7)
Glucose, Bld: 109 mg/dL — ABNORMAL HIGH (ref 65–99)
Potassium: 4 mmol/L (ref 3.5–5.3)
Sodium: 139 mmol/L (ref 135–146)
Total Bilirubin: 0.3 mg/dL (ref 0.2–1.2)
Total Protein: 7.2 g/dL (ref 6.1–8.1)
eGFR: 92 mL/min/{1.73_m2} (ref >=60)

## 2022-10-29 LAB — CBC WITH DIFFERENTIAL/PLATELET
Absolute Monocytes: 493 cells/uL (ref 200–950)
Basophils Absolute: 51 cells/uL (ref 0–200)
Basophils Relative: 0.8 %
Eosinophils Absolute: 90 cells/uL (ref 15–500)
Eosinophils Relative: 1.4 %
HCT: 38.7 % (ref 35.0–45.0)
Hemoglobin: 13 g/dL (ref 11.7–15.5)
Lymphs Abs: 1926 cells/uL (ref 850–3900)
MCH: 29.4 pg (ref 27.0–33.0)
MCHC: 33.6 g/dL (ref 32.0–36.0)
MCV: 87.6 fL (ref 80.0–100.0)
MPV: 10.2 fL (ref 7.5–12.5)
Monocytes Relative: 7.7 %
Neutro Abs: 3840 cells/uL (ref 1500–7800)
Neutrophils Relative %: 60 %
Platelets: 384 10*3/uL (ref 140–400)
RBC: 4.42 10*6/uL (ref 3.80–5.10)
RDW: 13.5 % (ref 11.0–15.0)
Total Lymphocyte: 30.1 %
WBC: 6.4 10*3/uL (ref 3.8–10.8)

## 2022-10-29 LAB — LIPID PANEL
Cholesterol: 221 mg/dL — ABNORMAL HIGH (ref <200)
HDL: 68 mg/dL (ref >=50)
LDL Cholesterol (Calc): 136 mg/dL (calc) — ABNORMAL HIGH
Non-HDL Cholesterol (Calc): 153 mg/dL (calc) — ABNORMAL HIGH (ref <130)
Total CHOL/HDL Ratio: 3.3 (calc) (ref <5.0)
Triglycerides: 80 mg/dL (ref <150)

## 2022-10-29 LAB — HEMOGLOBIN A1C
Hgb A1c MFr Bld: 6.5 % of total Hgb — ABNORMAL HIGH (ref <5.7)
Mean Plasma Glucose: 140 mg/dL
eAG (mmol/L): 7.7 mmol/L

## 2022-10-29 LAB — TSH: TSH: 3.63 mIU/L (ref 0.40–4.50)

## 2022-10-31 ENCOUNTER — Ambulatory Visit: Payer: Medicare Other | Admitting: Pharmacist

## 2022-10-31 DIAGNOSIS — I10 Essential (primary) hypertension: Secondary | ICD-10-CM

## 2022-10-31 MED ORDER — VALSARTAN-HYDROCHLOROTHIAZIDE 320-25 MG PO TABS: 1.0000 | ORAL_TABLET | Freq: Every day | ORAL | 0 refills | Status: DC

## 2022-10-31 NOTE — Patient Instructions (Signed)
Goals Addressed             This Visit's Progress    Pharmacy Goals       Check your blood pressure once daily, and any time you have concerning symptoms like headache, chest pain, dizziness, shortness of breath, or vision changes.   Our goal is less than 130/80.  To appropriately check your blood pressure, make sure you do the following:  1) Avoid caffeine, exercise, or tobacco products for 30 minutes before checking. Empty your bladder. 2) Sit with your back supported in a flat-backed chair. Rest your arm on something flat (arm of the chair, table, etc). 3) Sit still with your feet flat on the floor, resting, for at least 5 minutes.  4) Check your blood pressure. Take 1-2 readings.  5) Write down these readings and bring with you to any provider appointments.  Bring your home blood pressure machine with you to a provider's office for accuracy comparison at least once a year.   Make sure you take your blood pressure medications before you come to any office visit, even if you were asked to fast for labs.  Wallace Cullens, PharmD, [place], CPP Clinical Pharmacist [facility] [phone]

## 2022-10-31 NOTE — Progress Notes (Signed)
10/31/2022 Name: DALAYNA MATHUR MRN: [id number] DOB: [birth year]  Chief Complaint  Patient presents with   Medication Management    Gypsy R Brumm is a 72 y.o. year old female who presented for a telephone visit.   They were referred to the pharmacist by their PCP for assistance in managing diabetes and hypertension.    Subjective:  Care Team: Primary Care Provider: Olin Hauser, DO   Medication Access/Adherence  Current Pharmacy:  [facility], [address] AT [address] [place] [address] Phone: [phone] Fax: [phone]   Patient reports affordability concerns with their medications: No  Patient reports access/transportation concerns to their pharmacy: No  Patient reports adherence concerns with their medications:  No     Hypertension:  Current medications: Valsartan-HCTZ 160-12.71m - 2 tablets daily in morning (increased on 10/14/2022) Medications previously tried: lisinopril-HCTZ (cough)   Patient has a validated, automated, upper arm home BP cuff Current blood pressure readings readings:  2/1: 123/70, HR 78 2/2: 131/72, HR 69; 134/75, HR 75 2/3: 137/69, HR 78 2/4: 132/73, HR 68 2/5: 139/77, HR 71 2/6: 130/72, HR 72 2/7: 129/69, HR 68 2/8: 139/69, HR 74  *Attributes variability in readings to sometime not resting fully before readings  Patient denies hypotensive s/sx including dizziness, lightheadedness.   Caffeine: rarely drinks caffeine    Reports limits salt/sodium in her diet. Uses salt-free seasoning  Current physical activity: reports recently walking only occasionally, but plans to start walking consistently on Tuesdays  Patient requested updated Rx today for valsartan HCTZ 320-25 mg Rx, so she can take just 1 tablet daily   Diabetes:  Current medications: none  Current dietary patterns: - Breakfast: oatmeal or granola or egg whites and spinach -  Lunch: leftovers from supper (vegetables and chicken breast) - Supper: vegetables and chicken breast or vegetable soup - Drinks: water and some green tea or lemon ginger - Snacks: salt-free nuts (pecans, walnuts, almonds), seeds and dried fruit mix  Current physical activity: reports recently walking only occasionally, but plans to start walking consistently on Tuesdays  Statin therapy: none  Objective:  Lab Results  Component Value Date   HGBA1C 6.5 (H) 10/28/2022    Lab Results  Component Value Date   CREATININE 0.69 10/28/2022   BUN 18 10/28/2022   NA 139 10/28/2022   K 4.0 10/28/2022   CL 99 10/28/2022   CO2 32 10/28/2022    Lab Results  Component Value Date   CHOL 221 (H) 10/28/2022   HDL 68 10/28/2022   LDLCALC 136 (H) 10/28/2022   TRIG 80 10/28/2022   CHOLHDL 3.3 10/28/2022   BP Readings from Last 3 Encounters:  09/12/22 (!) 148/90  08/12/22 (!) 160/90  03/01/19 (!) 190/120   Pulse Readings from Last 3 Encounters:  09/12/22 80  08/12/22 71  03/01/19 72    Medications Reviewed Today     Reviewed by KOlin Hauser DO (Physician) on 09/12/22 at 1808  Med List Status: <None>   Medication Order Taking? Sig Documenting Provider Last Dose Status Informant  b complex vitamins capsule [id number] Take 1 capsule by mouth daily. [provider] Taking Active   CALCIUM MAGNESIUM ZINC PO [id number] Take by mouth. [provider]  Active   dorzolamide-timolol (COSOPT) 2-0.5 % ophthalmic solution [id number] 1 drop 2 (two) times daily. [provider] Taking Active   fexofenadine (ALLEGRA) 180  MG tablet [id number] Yes Take 180 mg by mouth daily. [provider] Taking Active   latanoprost (XALATAN) 0.005 % ophthalmic solution [id number] Yes Place 1 drop into both eyes at bedtime. [provider] Taking Active   Multiple Vitamin (MULTIVITAMIN) tablet [id number] Yes Take 1 tablet by mouth daily. [provider] Taking Active Self  valsartan-hydrochlorothiazide (DIOVAN-HCT) 160-12.5 MG tablet [id number] Yes Take 1 tablet by mouth daily. Olin Hauser, DO  Active               Assessment/Plan:   Diabetes: - Reviewed long term cardiovascular and renal outcomes of uncontrolled blood sugar - Reviewed dietary modifications including having regular well-balanced meals while controlling carbohydrate portion sizes - Encourage patient to review nutrition labels for total carbohydrate content of foods - Will mail patient healthy meal planning educational handout as requested   Hypertension: - Currently improved based on reported home readings - Reviewed long term cardiovascular and renal outcomes of uncontrolled blood pressure - Reviewed appropriate home BP monitoring technique (avoid caffeine and exercise for 30 minutes before checking, rest for at least 5 minutes before taking BP, sit with feet flat on the floor and back against a hard surface, uncross legs, and rest arm on flat surface) - Discussed dietary modifications, such as reduced salt intake, focus on whole grains, vegetables, lean proteins - Reviewed strategies to improve medication adherence such as using weekly pillbox - CPP will send updated Rx for valsartan-HCTZ 320-25 mg Rx to pharmacy as requested, so she can take just 1 tablet daily Patient states plans to use up current supply of previous strength, but verbalizes understanding to take just 1 tablet daily with this new Rx - Counsel patient to check blood pressure, document, have to provide at next visit, but to contact office sooner for readings outside of established parameters or for any dizziness or other symptoms     Hyperlipidemia/ASCVD Risk Reduction: - Currently uncontrolled.  - Reviewed long term complications of uncontrolled cholesterol - Reviewed dietary recommendations including reducing intake of saturated and trans fats - Recommend to consider  starting statin therapy. Counsel on benefits of statin therapy for LDL lowering/ASCVD risk reduction   Patient declines to start statin therapy today, but agreeable to discuss during our next telephone  appointment   Follow Up Plan: Clinical Pharmacist will follow up with patient by telephone on 11/28/2022 at 10:45 am  Wallace Cullens, PharmD, [facility] [place], [facility] [phone]

## 2022-11-21 ENCOUNTER — Ambulatory Visit (INDEPENDENT_AMBULATORY_CARE_PROVIDER_SITE_OTHER): Payer: Medicare Other

## 2022-11-21 VITALS — Ht 61.0 in | Wt 164.0 lb

## 2022-11-21 DIAGNOSIS — Z0000 Encounter for general adult medical examination without abnormal findings: Secondary | ICD-10-CM

## 2022-11-21 DIAGNOSIS — E119 Type 2 diabetes mellitus without complications: Secondary | ICD-10-CM

## 2022-11-21 NOTE — Progress Notes (Signed)
I connected with  Emily Hunt on 11/21/22 by a audio enabled telemedicine application and verified that I am speaking with the correct person using two identifiers.  Patient Location: Home  Provider Location: Office/Clinic  I discussed the limitations of evaluation and management by telemedicine. The patient expressed understanding and agreed to proceed.  Subjective:   Emily Hunt is a 72 y.o. female who presents for an Initial Medicare Annual Wellness Visit.  Review of Systems     Cardiac Risk Factors include: advanced age (>17mn, >>33women);hypertension     Objective:    There were no vitals filed for this visit. There is no height or weight on file to calculate BMI.     11/21/2022   11:37 AM 03/01/2019    8:46 AM 01/23/2018    8:37 AM  Advanced Directives  Does Patient Have a Medical Advance Directive? Yes No Yes  Type of [place]aramed[place]of [place] will  [place] Does patient want to make changes to medical advance directive? No - Patient declined    Copy of HAmitein Chart? Yes - validated most recent copy scanned in chart (See row information)      Current Medications (verified) Outpatient Encounter Medications as of 11/21/2022  Medication Sig   b complex vitamins capsule Take 1 capsule by mouth daily.   CALCIUM MAGNESIUM ZINC PO Take by mouth.   dorzolamide-timolol (COSOPT) 2-0.5 % ophthalmic solution 1 drop 2 (two) times daily.   fexofenadine (ALLEGRA) 180 MG tablet Take 180 mg by mouth daily.   latanoprost (XALATAN) 0.005 % ophthalmic solution Place 1 drop into both eyes at bedtime.   Multiple Vitamin (MULTIVITAMIN) tablet Take 1 tablet by mouth daily.   valsartan-hydrochlorothiazide (DIOVAN-HCT) 320-25 MG tablet Take 1 tablet by mouth daily.   No facility-administered encounter medications on file as of 11/21/2022.    Allergies (verified) Sulfa antibiotics, Neomycin, and Strawberry extract    History: Past Medical History:  Diagnosis Date   Glaucoma    History reviewed. No pertinent surgical history. History reviewed. No pertinent family history. Social History   Socioeconomic History   Marital status: Widowed    Spouse name: Not on file   Number of children: Not on file   Years of education: Not on file   Highest education level: Not on file  Occupational History   Not on file  Tobacco Use   Smoking status: Never   Smokeless tobacco: Never  Vaping Use   Vaping Use: Never used  Substance and Sexual Activity   Alcohol use: Never   Drug use: Not on file   Sexual activity: Not on file  Other Topics Concern   Not on file  Social History Narrative   Not on file   Social Determinants of Health   Financial Resource Strain: Low Risk  (11/21/2022)   Overall Financial Resource Strain (CARDIA)    Difficulty of Paying Living Expenses: Not hard at all  Food Insecurity: No Food Insecurity (11/21/2022)   Hunger Vital Sign    Worried About Running Out of Food in the Last Year: Never true    RBig [place]pp[place] Keyin the Last Year: Never true  Transportation Needs: No Transportation Needs (11/21/2022)   PRAPARE - THydrologist(Medical): No    Lack of Transportation (Non-Medical): No  Physical Activity: Insufficiently Active (11/21/2022)   Exercise Vital Sign    Days of Exercise per Week:  2 days    Minutes of Exercise per Session: 20 min  Stress: No Stress Concern Present (11/21/2022)   [place]    Feeling of Stress : Only a little  Social Connections: Moderately Isolated (11/21/2022)   Social Connection and Isolation Panel [NHANES]    Frequency of Communication with Friends and Family: More than three times a week    Frequency of Social Gatherings with Friends and Family: More than three times a week    Attends Religious Services: More than 4 times per year    Active Member of [employer] or  Organizations: No    Attends Archivist Meetings: Never    Marital Status: Widowed    Tobacco Counseling Counseling given: Not Answered   Clinical Intake:  Pre-visit preparation completed: Yes  Pain : No/denies pain     Nutritional Risks: None Diabetes: Yes CBG done?: No Did pt. bring in CBG monitor from home?: No  How often do you need to have someone help you when you read instructions, pamphlets, or other written materials from your doctor or pharmacy?: 1 - Never  Diabetic?yes Nutrition Risk Assessment:  Has the patient had any N/V/D within the last 2 months?  No  Does the patient have any non-healing wounds?  No  Has the patient had any unintentional weight loss or weight gain?  No   Diabetes:  Is the patient diabetic?  Yes -borderline If diabetic, was a CBG obtained today?  No  Did the patient bring in their glucometer from home?  No  How often do you monitor your CBG's? never.   Financial Strains and Diabetes Management:  Are you having any financial strains with the device, your supplies or your medication? No .  Does the patient want to be seen by Chronic Care Management for management of their diabetes?  No  Would the patient like to be referred to a Nutritionist or for Diabetic Management?  Yes   Diabetic Exams:  Diabetic Eye Exam: Completed no. Overdue for diabetic eye exam. Pt has been advised about the importance in completing this exam. Will contact Dr.Brasington Diabetic Foot Exam: Completed 09/12/22. Pt has been advised about the importance in completing this exam.  Interpreter Needed?: No  Information entered by :: Kirke Shaggy, LPN   Activities of Daily Living    11/21/2022   11:38 AM 11/17/2022   10:38 PM  In your present state of health, do you have any difficulty performing the following activities:  Hearing? 0 0  Vision? 0 0  Difficulty concentrating or making decisions? 0 0  Walking or climbing stairs? 0 0  Dressing or  bathing? 0 0  Doing errands, shopping? 0 0  Preparing Food and eating ? N N  Using the Toilet? N N  In the past six months, have you accidently leaked urine? N N  Do you have problems with loss of bowel control? N N  Managing your Medications? N N  Managing your Finances? N N  Housekeeping or managing your Housekeeping? N N    Patient Care Team: Olin Hauser, DO as PCP - General (Family Medicine)  Indicate any recent Medical Services you may have received from other than [facility] providers in the past year (date may be approximate).     Assessment:   This is a routine wellness examination for Emily Hunt.  Hearing/Vision screen Hearing Screening - Comments:: No aids Vision Screening - Comments:: Wears glasses- Dr.Brasington  Dietary  issues and exercise activities discussed: Current Exercise Habits: Home exercise routine, Type of exercise: walking, Time (Minutes): 20, Frequency (Times/Week): 2, Weekly Exercise (Minutes/Week): 40, Intensity: Mild   Goals Addressed             This Visit's Progress    DIET - EAT MORE FRUITS AND VEGETABLES         Depression Screen    11/21/2022   11:34 AM 08/12/2022   10:14 AM  PHQ 2/9 Scores  PHQ - 2 Score 1 0  PHQ- 9 Score 1 0    Fall Risk    11/21/2022   11:38 AM 11/17/2022   10:38 PM 08/12/2022   10:12 AM  Fall Risk   Falls in the past year? 0 0 1  Number falls in past yr: 0  1  Injury with Fall? 0  1  Risk for fall due to : No Fall Risks  History of fall(s)  Follow up Falls prevention discussed;Falls evaluation completed  Falls evaluation completed    FALL RISK PREVENTION PERTAINING TO THE HOME:  Any stairs in or around the home? Yes  If so, are there any without handrails? No  Home free of loose throw rugs in walkways, pet beds, electrical cords, etc? Yes  Adequate lighting in your home to reduce risk of falls? Yes   ASSISTIVE DEVICES UTILIZED TO PREVENT FALLS:  Life alert? No  Use of a cane, walker or w/c? No   Grab bars in the bathroom? No  Shower chair or bench in shower? No  Elevated toilet seat or a handicapped toilet? No   Cognitive Function:        11/21/2022   11:42 AM  6CIT Screen  What Year? 0 points  What month? 0 points  What time? 0 points  Count back from 20 0 points  Months in reverse 0 points  Repeat phrase 0 points  Total Score 0 points    Immunizations Immunization History  Administered Date(s) Administered   Influenza, High Dose Seasonal PF 07/24/2017   Influenza-Unspecified 07/08/2022   Moderna Sars-Covid-2 Vaccination 07/04/2022   PNEUMOCOCCAL CONJUGATE-20 07/08/2022   Pneumococcal Polysaccharide-23 07/17/2014   Respiratory Syncytial Virus Vaccine,Recomb Aduvanted(Arexvy) 07/04/2022    TDAP status: Due, Education has been provided regarding the importance of this vaccine. Advised may receive this vaccine at local pharmacy or Health Dept. Aware to provide a copy of the vaccination record if obtained from local pharmacy or Health Dept. Verbalized acceptance and understanding.  Flu Vaccine status: Up to date  Pneumococcal vaccine status: Up to date  Covid-19 vaccine status: Completed vaccines  Qualifies for Shingles Vaccine? Yes   Zostavax completed No   Shingrix Completed?: No.    Education has been provided regarding the importance of this vaccine. Patient has been advised to call insurance company to determine out of pocket expense if they have not yet received this vaccine. Advised may also receive vaccine at local pharmacy or Health Dept. Verbalized acceptance and understanding.  Screening Tests Health Maintenance  Topic Date Due   OPHTHALMOLOGY EXAM  Never done   DTaP/Tdap/Td (1 - Tdap) Never done   Zoster Vaccines- Shingrix (1 of 2) Never done   DEXA SCAN  Never done   COVID-19 Vaccine (2 - 2023-24 season) 08/29/2022   MAMMOGRAM  09/13/2023 (Originally [birth year]/12/1999)   Hepatitis C Screening  09/13/2023 (Originally 06/12/1969)   HEMOGLOBIN A1C   04/28/2023   Diabetic kidney evaluation - Urine ACR  09/13/2023   FOOT EXAM  09/13/2023   Diabetic kidney evaluation - eGFR measurement  10/29/2023   Medicare Annual Wellness (AWV)  11/21/2023   Fecal DNA (Cologuard)  09/26/2025   Pneumonia Vaccine 83+ Years old  Completed   INFLUENZA VACCINE  Completed   HPV VACCINES  Aged Out    Health Maintenance  Health Maintenance Due  Topic Date Due   OPHTHALMOLOGY EXAM  Never done   DTaP/Tdap/Td (1 - Tdap) Never done   Zoster Vaccines- Shingrix (1 of 2) Never done   DEXA SCAN  Never done   COVID-19 Vaccine (2 - 2023-24 season) 08/29/2022    Colorectal cancer screening: Type of screening: Cologuard. Completed 09/26/22. Repeat every 3 years  Declined mammogram and BDS referral    Lung Cancer Screening: (Low Dose CT Chest recommended if Age 43-80 years, 30 pack-year currently smoking OR have quit w/in 15years.) does not qualify.   Additional Screening:  Hepatitis C Screening: does qualify; Completed no  Vision Screening: Recommended annual ophthalmology exams for early detection of glaucoma and other disorders of the eye. Is the patient up to date with their annual eye exam?  Yes  Who is the provider or what is the name of the office in which the patient attends annual eye exams? Dr.Brasington If pt is not established with a provider, would they like to be referred to a provider to establish care? No .   Dental Screening: Recommended annual dental exams for proper oral hygiene  Community Resource Referral / Chronic Care Management: CRR required this visit?  No   CCM required this visit?  No      Plan:     I have personally reviewed and noted the following in the patient's chart:   Medical and social history Use of alcohol, tobacco or illicit drugs  Current medications and supplements including opioid prescriptions. Patient is not currently taking opioid prescriptions. Functional ability and status Nutritional  status Physical activity Advanced directives List of other physicians Hospitalizations, surgeries, and ER visits in previous 12 months Vitals Screenings to include cognitive, depression, and falls Referrals and appointments  In addition, I have reviewed and discussed with patient certain preventive protocols, quality metrics, and best practice recommendations. A written personalized care plan for preventive services as well as general preventive health recommendations were provided to patient.     Dionisio David, LPN   [id number]   Nurse Notes: none

## 2022-11-21 NOTE — Patient Instructions (Signed)
[name] , Thank you for taking time to come for your Medicare Wellness Visit. I appreciate your ongoing commitment to your health goals. Please review the following plan we discussed and let me know if I can assist you in the future.   These are the goals we discussed:  Goals      DIET - EAT MORE FRUITS AND VEGETABLES     Pharmacy Goals     Check your blood pressure once daily, and any time you have concerning symptoms like headache, chest pain, dizziness, shortness of breath, or vision changes.   Our goal is less than 130/80.  To appropriately check your blood pressure, make sure you do the following:  1) Avoid caffeine, exercise, or tobacco products for 30 minutes before checking. Empty your bladder. 2) Sit with your back supported in a flat-backed chair. Rest your arm on something flat (arm of the chair, table, etc). 3) Sit still with your feet flat on the floor, resting, for at least 5 minutes.  4) Check your blood pressure. Take 1-2 readings.  5) Write down these readings and bring with you to any provider appointments.  Bring your home blood pressure machine with you to a provider's office for accuracy comparison at least once a year.   Make sure you take your blood pressure medications before you come to any office visit, even if you were asked to fast for labs.  Wallace Cullens, PharmD, Para March, CPP Clinical Pharmacist [facility] [phone]         This is a list of the screening recommended for you and due dates:  Health Maintenance  Topic Date Due   Eye exam for diabetics  Never done   DTaP/Tdap/Td vaccine (1 - Tdap) Never done   Zoster (Shingles) Vaccine (1 of 2) Never done   DEXA scan (bone density measurement)  Never done   COVID-19 Vaccine (2 - 2023-24 season) 08/29/2022   Mammogram  09/13/2023*   Hepatitis C Screening: USPSTF Recommendation to screen - Ages 18-79 yo.  09/13/2023*   Hemoglobin A1C  04/28/2023   Yearly kidney  health urinalysis for diabetes  09/13/2023   Complete foot exam   09/13/2023   Yearly kidney function blood test for diabetes  10/29/2023   Medicare Annual Wellness Visit  11/21/2023   Cologuard (Stool DNA test)  09/26/2025   Pneumonia Vaccine  Completed   Flu Shot  Completed   HPV Vaccine  Aged Out  *Topic was postponed. The date shown is not the original due date.    Advanced directives: no  Conditions/risks identified: none  Next appointment: Follow up in one year for your annual wellness visit 11/27/23 @ 10:45 am by phone   Preventive Care 65 Years and Older, Female Preventive care refers to lifestyle choices and visits with your health care provider that can promote health and wellness. What does preventive care include? A yearly physical exam. This is also called an annual well check. Dental exams once or twice a year. Routine eye exams. Ask your health care provider how often you should have your eyes checked. Personal lifestyle choices, including: Daily care of your teeth and gums. Regular physical activity. Eating a healthy diet. Avoiding tobacco and drug use. Limiting alcohol use. Practicing safe sex. Taking low-dose aspirin every day. Taking vitamin and mineral supplements as recommended by your health care provider. What happens during an annual well check? The services and screenings done by your health care provider during your  annual well check will depend on your age, overall health, lifestyle risk factors, and family history of disease. Counseling  Your health care provider may ask you questions about your: Alcohol use. Tobacco use. Drug use. Emotional well-being. Home and relationship well-being. Sexual activity. Eating habits. History of falls. Memory and ability to understand (cognition). Work and work Statistician. Reproductive health. Screening  You may have the following tests or measurements: Height, weight, and BMI. Blood pressure. Lipid and  cholesterol levels. These may be checked every 5 years, or more frequently if you are over 9 years old. Skin check. Lung cancer screening. You may have this screening every year starting at age 32 if you have a 30-pack-year history of smoking and currently smoke or have quit within the past 15 years. Fecal occult blood test (FOBT) of the stool. You may have this test every year starting at age 68. Flexible sigmoidoscopy or colonoscopy. You may have a sigmoidoscopy every 5 years or a colonoscopy every 10 years starting at age 20. Hepatitis C blood test. Hepatitis B blood test. Sexually transmitted disease (STD) testing. Diabetes screening. This is done by checking your blood sugar (glucose) after you have not eaten for a while (fasting). You may have this done every 1-3 years. Bone density scan. This is done to screen for osteoporosis. You may have this done starting at age 66. Mammogram. This may be done every 1-2 years. Talk to your health care provider about how often you should have regular mammograms. Talk with your health care provider about your test results, treatment options, and if necessary, the need for more tests. Vaccines  Your health care provider may recommend certain vaccines, such as: Influenza vaccine. This is recommended every year. Tetanus, diphtheria, and acellular pertussis (Tdap, Td) vaccine. You may need a Td booster every 10 years. Zoster vaccine. You may need this after age 48. Pneumococcal 13-valent conjugate (PCV13) vaccine. One dose is recommended after age 66. Pneumococcal polysaccharide (PPSV23) vaccine. One dose is recommended after age 11. Talk to your health care provider about which screenings and vaccines you need and how often you need them. This information is not intended to replace advice given to you by your health care provider. Make sure you discuss any questions you have with your health care provider. Document Released: 10/05/2015 Document Revised:  05/28/2016 Document Reviewed: 07/10/2015 Elsevier Interactive Patient Education  2017 [employer] Prevention in the Home Falls can cause injuries. They can happen to people of all ages. There are many things you can do to make your home safe and to help prevent falls. What can I do on the outside of my home? Regularly fix the edges of walkways and driveways and fix any cracks. Remove anything that might make you trip as you walk through a door, such as a raised step or threshold. Trim any bushes or trees on the path to your home. Use bright outdoor lighting. Clear any walking paths of anything that might make someone trip, such as rocks or tools. Regularly check to see if handrails are loose or broken. Make sure that both sides of any steps have handrails. Any raised decks and porches should have guardrails on the edges. Have any leaves, snow, or ice cleared regularly. Use sand or salt on walking paths during winter. Clean up any spills in your garage right away. This includes oil or grease spills. What can I do in the bathroom? Use night lights. Install grab bars by the toilet and in  the tub and shower. Do not use towel bars as grab bars. Use non-skid mats or decals in the tub or shower. If you need to sit down in the shower, use a plastic, non-slip stool. Keep the floor dry. Clean up any water that spills on the floor as soon as it happens. Remove soap buildup in the tub or shower regularly. Attach bath mats securely with double-sided non-slip rug tape. Do not have throw rugs and other things on the floor that can make you trip. What can I do in the bedroom? Use night lights. Make sure that you have a light by your bed that is easy to reach. Do not use any sheets or blankets that are too big for your bed. They should not hang down onto the floor. Have a firm chair that has side arms. You can use this for support while you get dressed. Do not have throw rugs and other things  on the floor that can make you trip. What can I do in the kitchen? Clean up any spills right away. Avoid walking on wet floors. Keep items that you use a lot in easy-to-reach places. If you need to reach something above you, use a strong step stool that has a grab bar. Keep electrical cords out of the way. Do not use floor polish or wax that makes floors slippery. If you must use wax, use non-skid floor wax. Do not have throw rugs and other things on the floor that can make you trip. What can I do with my stairs? Do not leave any items on the stairs. Make sure that there are handrails on both sides of the stairs and use them. Fix handrails that are broken or loose. Make sure that handrails are as long as the stairways. Check any carpeting to make sure that it is firmly attached to the stairs. Fix any carpet that is loose or worn. Avoid having throw rugs at the top or bottom of the stairs. If you do have throw rugs, attach them to the floor with carpet tape. Make sure that you have a light switch at the top of the stairs and the bottom of the stairs. If you do not have them, ask someone to add them for you. What else can I do to help prevent falls? Wear shoes that: Do not have high heels. Have rubber bottoms. Are comfortable and fit you well. Are closed at the toe. Do not wear sandals. If you use a stepladder: Make sure that it is fully opened. Do not climb a closed stepladder. Make sure that both sides of the stepladder are locked into place. Ask someone to hold it for you, if possible. Clearly mark and make sure that you can see: Any grab bars or handrails. First and last steps. Where the edge of each step is. Use tools that help you move around (mobility aids) if they are needed. These include: Canes. Walkers. Scooters. Crutches. Turn on the lights when you go into a dark area. Replace any light bulbs as soon as they burn out. Set up your furniture so you have a clear path. Avoid  moving your furniture around. If any of your floors are uneven, fix them. If there are any pets around you, be aware of where they are. Review your medicines with your doctor. Some medicines can make you feel dizzy. This can increase your chance of falling. Ask your doctor what other things that you can do to help prevent falls. This information  is not intended to replace advice given to you by your health care provider. Make sure you discuss any questions you have with your health care provider. Document Released: 07/05/2009 Document Revised: 02/14/2016 Document Reviewed: 10/13/2014 Elsevier Interactive Patient Education  2017 [employer].

## 2022-11-28 ENCOUNTER — Ambulatory Visit: Payer: Medicare Other | Admitting: Pharmacist

## 2022-11-28 DIAGNOSIS — E1169 Type 2 diabetes mellitus with other specified complication: Secondary | ICD-10-CM

## 2022-11-28 DIAGNOSIS — E119 Type 2 diabetes mellitus without complications: Secondary | ICD-10-CM

## 2022-11-28 DIAGNOSIS — I10 Essential (primary) hypertension: Secondary | ICD-10-CM

## 2022-11-28 NOTE — Progress Notes (Signed)
11/28/2022 Name: Emily Hunt MRN: [id number] DOB: Sep 30, 1950  Chief Complaint  Patient presents with   Medication Management    Emily Hunt is a 72 y.o. year old female who presented for a telephone visit.   They were referred to the pharmacist by their PCP for assistance in managing diabetes and hypertension.    Subjective:  Care Team: Primary Care Provider: Olin Hauser, DO   Medication Access/Adherence  Current Pharmacy:  [place], [address] [facility] [address] [address] Phone: [phone] Fax: [phone]   Patient reports affordability concerns with their medications: No  Patient reports access/transportation concerns to their pharmacy: No  Patient reports adherence concerns with their medications:  No       Hypertension:   Current medications: valsartan-HCTZ 320-25 mg - 1 tablets daily in morning Medications previously tried: lisinopril-HCTZ (cough)    Patient has a validated, automated, upper arm home BP cuff Reports recent blood pressure readings readings:  3/6: 127/70, HR 62 3/5: 129/71, HR 62    Patient denies hypotensive s/sx including dizziness, lightheadedness.    Caffeine: rarely drinks caffeine    Reports limits salt/sodium in her diet. Uses salt-free seasoning   Current physical activity: reports limited recently, but plans to start walking weekly with a friend    Objective:  Lab Results  Component Value Date   HGBA1C 6.5 (H) 10/28/2022    Lab Results  Component Value Date   CREATININE 0.69 10/28/2022   BUN 18 10/28/2022   NA 139 10/28/2022   K 4.0 10/28/2022   CL 99 10/28/2022   CO2 32 10/28/2022    Lab Results  Component Value Date   CHOL 221 (H) 10/28/2022   HDL 68 10/28/2022   LDLCALC 136 (H) 10/28/2022   TRIG 80 10/28/2022   CHOLHDL 3.3 10/28/2022   BP Readings from Last 3 Encounters:  09/12/22 (!) 148/90  08/12/22  (!) 160/90  03/01/19 (!) 190/120   Pulse Readings from Last 3 Encounters:  09/12/22 80  08/12/22 71  03/01/19 72    Medications Reviewed Today     Reviewed by Rennis Petty, RPH-CPP (Pharmacist) on 11/28/22 at 1321  Med List Status: <None>   Medication Order Taking? Sig Documenting Provider Last Dose Status Informant  b complex vitamins capsule [id number]  Take 1 capsule by mouth daily. [provider]  Active   CALCIUM MAGNESIUM ZINC PO [id number]  Take by mouth. [provider]  Active   dorzolamide-timolol (COSOPT) 2-0.5 % ophthalmic solution [id number]  1 drop 2 (two) times daily. [provider]  Active   fexofenadine (ALLEGRA) 180 MG tablet [id number]  Take 180 mg by mouth daily. [provider]  Active   latanoprost (XALATAN) 0.005 % ophthalmic solution [id number]  Place 1 drop into both eyes at bedtime. [provider]  Active   Multiple Vitamin (MULTIVITAMIN) tablet [id number]  Take 1 tablet by mouth daily. [provider]  Active Self  valsartan-hydrochlorothiazide (DIOVAN-HCT) 320-25 MG tablet [id number] Yes Take 1 tablet by mouth daily. Olin Hauser, DO Taking Active               Assessment/Plan:   Diabetes: - Reviewed dietary modifications including having regular well-balanced meals while controlling carbohydrate portion sizes - Have encouraged patient to review nutrition labels for total carbohydrate content of foods     Hypertension: - Currently improved based  on reported home readings - Reviewed long term cardiovascular and renal outcomes of uncontrolled blood pressure - Reviewed appropriate home BP monitoring technique (avoid caffeine and exercise for 30 minutes before checking, rest for at least 5 minutes before taking BP, sit with feet flat on the floor and back against a hard surface, uncross legs, and rest arm on flat surface) - Discussed dietary modifications, such as reduced salt intake,  focus on whole grains, vegetables, lean proteins - Counsel patient to check blood pressure, document, have to provide at next visit, but to contact office sooner for readings outside of established parameters or for any dizziness or other symptoms      Hyperlipidemia/ASCVD Risk Reduction: - Currently uncontrolled.  - Have reviewed long term complications of uncontrolled cholesterol - Reviewed dietary recommendations including reducing intake of saturated and trans fats - Recommend to consider starting statin therapy. Counsel on benefits of statin therapy for LDL lowering/ASCVD risk reduction  Patient declines to start statin therapy today, rather states prefers to work on exercise and dietary changes for now, but agreeable to discuss with PCP at next follow up in June     Follow Up Plan: Clinical Pharmacist will follow up with patient by telephone on 04/24/2023 at 10:45 AM   Wallace Cullens, PharmD, Para March, [place] [phone]

## 2022-11-28 NOTE — Patient Instructions (Signed)
Goals Addressed             This Visit's Progress    Pharmacy Goals       Check your blood pressure once daily, and any time you have concerning symptoms like headache, chest pain, dizziness, shortness of breath, or vision changes.   Our goal is less than 130/80.  To appropriately check your blood pressure, make sure you do the following:  1) Avoid caffeine, exercise, or tobacco products for 30 minutes before checking. Empty your bladder. 2) Sit with your back supported in a flat-backed chair. Rest your arm on something flat (arm of the chair, table, etc). 3) Sit still with your feet flat on the floor, resting, for at least 5 minutes.  4) Check your blood pressure. Take 1-2 readings.  5) Write down these readings and bring with you to any provider appointments.  Bring your home blood pressure machine with you to a provider's office for accuracy comparison at least once a year.   Make sure you take your blood pressure medications before you come to any office visit, even if you were asked to fast for labs.  Danzel Marszalek Kenith Trickel, PharmD, BCACP, CPP Clinical Pharmacist [facility] [place] [phone]         

## 2023-01-06 DIAGNOSIS — H401132 Primary open-angle glaucoma, bilateral, moderate stage: Secondary | ICD-10-CM | POA: Diagnosis not present

## 2023-01-13 DIAGNOSIS — H401132 Primary open-angle glaucoma, bilateral, moderate stage: Secondary | ICD-10-CM | POA: Diagnosis not present

## 2023-01-13 DIAGNOSIS — H43813 Vitreous degeneration, bilateral: Secondary | ICD-10-CM | POA: Diagnosis not present

## 2023-01-13 DIAGNOSIS — H2513 Age-related nuclear cataract, bilateral: Secondary | ICD-10-CM | POA: Diagnosis not present

## 2023-01-13 DIAGNOSIS — E119 Type 2 diabetes mellitus without complications: Secondary | ICD-10-CM | POA: Diagnosis not present

## 2023-01-13 LAB — HM DIABETES EYE EXAM

## 2023-01-24 ENCOUNTER — Other Ambulatory Visit: Payer: Self-pay | Admitting: Family Medicine

## 2023-01-24 DIAGNOSIS — I10 Essential (primary) hypertension: Secondary | ICD-10-CM

## 2023-01-26 NOTE — Telephone Encounter (Signed)
Requested Prescriptions  Pending Prescriptions Disp Refills   lisinopril-hydrochlorothiazide (ZESTORETIC) 20-12.5 MG tablet [Pharmacy Med Name: LISINOPRIL-HCTZ 20/12.5MG  TABLETS] 30 tablet 2    Sig: TAKE 1 TABLET BY MOUTH DAILY     Cardiovascular:  ACEI + Diuretic Combos Failed - 01/24/2023  6:20 AM      Failed - Last BP in normal range    BP Readings from Last 1 Encounters:  09/12/22 (!) 148/90         Passed - Na in normal range and within 180 days    Sodium  Date Value Ref Range Status  10/28/2022 139 135 - 146 mmol/L Final         Passed - K in normal range and within 180 days    Potassium  Date Value Ref Range Status  10/28/2022 4.0 3.5 - 5.3 mmol/L Final         Passed - Cr in normal range and within 180 days    Creat  Date Value Ref Range Status  10/28/2022 0.69 0.60 - 1.00 mg/dL Final   Creatinine, Urine  Date Value Ref Range Status  09/12/2022 27 20 - 275 mg/dL Final         Passed - eGFR is 30 or above and within 180 days    eGFR  Date Value Ref Range Status  10/28/2022 92 > OR = 60 mL/min/1.59m2 Final         Passed - Patient is not pregnant      Passed - Valid encounter within last 6 months    Recent Outpatient Visits           1 month ago Essential hypertension   [place] [facility] Delles, Gentry Fitz A, RPH-CPP   2 months ago Essential hypertension   [place] [facility] Delles, Gentry Fitz A, RPH-CPP   3 months ago Essential hypertension   [place] [facility] Delles, Gentry Fitz A, RPH-CPP   4 months ago Type 2 diabetes mellitus with other specified complication, without long-term current use of insulin [facility])   [place] [facility] [place], Netta Neat, DO   4 months ago Essential hypertension   [place] [facility] Delles, Jackelyn Poling, RPH-CPP       Future Appointments             In 1 month Althea Charon, Netta Neat, DO [place] [facility], [facility]             valsartan-hydrochlorothiazide (DIOVAN-HCT) 160-12.5 MG tablet [Pharmacy Med Name: VALSARTAN/HCTZ 160MG /12.5MG  TABLETS] 30 tablet 2    Sig: TAKE 1 TABLET BY MOUTH DAILY     Cardiovascular: ARB + Diuretic Combos Failed - 01/24/2023  6:20 AM      Failed - Last BP in normal range    BP Readings from Last 1 Encounters:  09/12/22 (!) 148/90         Passed - K in normal range and within 180 days    Potassium  Date Value Ref Range Status  10/28/2022 4.0 3.5 - 5.3 mmol/L Final         Passed - Na in normal range and within 180 days    Sodium  Date Value Ref Range Status  10/28/2022 139 135 - 146 mmol/L Final         Passed - Cr in normal range and within 180 days    Creat  Date Value Ref Range Status  10/28/2022  0.69 0.60 - 1.00 mg/dL Final   Creatinine, Urine  Date Value Ref Range Status  09/12/2022 27 20 - 275 mg/dL Final         Passed - eGFR is 10 or above and within 180 days    eGFR  Date Value Ref Range Status  10/28/2022 92 > OR = 60 mL/min/1.93m2 Final         Passed - Patient is not pregnant      Passed - Valid encounter within last 6 months    Recent Outpatient Visits           1 month ago Essential hypertension   [facility] [facility] Delles, Gentry Fitz A, RPH-CPP   2 months ago Essential hypertension   [place] [facility] Delles, Gentry Fitz A, RPH-CPP   3 months ago Essential hypertension   [place] [facility] Delles, Gentry Fitz A, RPH-CPP   4 months ago Type 2 diabetes mellitus with other specified complication, without long-term current use of insulin (HCC)   [place] [facility] [place], Netta Neat, DO   4 months ago Essential hypertension   [place] [facility] Delles, Jackelyn Poling, RPH-CPP       Future Appointments             In 1 month Althea Charon, Netta Neat, DO [place] [facility], [facility]              valsartan-hydrochlorothiazide (DIOVAN-HCT) 320-25 MG tablet [Pharmacy Med Name: VALSARTAN/HCTZ 320MG /25MG  TABLETS] 90 tablet 0    Sig: TAKE 1 TABLET BY MOUTH DAILY     Cardiovascular: ARB + Diuretic Combos Failed - 01/24/2023  6:20 AM      Failed - Last BP in normal range    BP Readings from Last 1 Encounters:  09/12/22 (!) 148/90         Passed - K in normal range and within 180 days    Potassium  Date Value Ref Range Status  10/28/2022 4.0 3.5 - 5.3 mmol/L Final         Passed - Na in normal range and within 180 days    Sodium  Date Value Ref Range Status  10/28/2022 139 135 - 146 mmol/L Final         Passed - Cr in normal range and within 180 days    Creat  Date Value Ref Range Status  10/28/2022 0.69 0.60 - 1.00 mg/dL Final   Creatinine, Urine  Date Value Ref Range Status  09/12/2022 27 20 - 275 mg/dL Final         Passed - eGFR is 10 or above and within 180 days    eGFR  Date Value Ref Range Status  10/28/2022 92 > OR = 60 mL/min/1.94m2 Final         Passed - Patient is not pregnant      Passed - Valid encounter within last 6 months    Recent Outpatient Visits           1 month ago Essential hypertension   [place] [facility] Delles, Jackelyn Poling, RPH-CPP   2 months ago Essential hypertension   [place] [facility] Delles, Gentry Fitz A, RPH-CPP   3 months ago Essential hypertension   [place] [facility] Delles, Gentry Fitz A, RPH-CPP   4 months ago Type 2 diabetes mellitus with other specified complication, without long-term current  use of insulin [facility])   [place] [facility] [place], Netta Neat, DO   4 months ago Essential hypertension   [place] [facility] Delles, Jackelyn Poling, RPH-CPP       Future Appointments             In 1 month Althea Charon, Netta Neat, DO [place] [facility], [place]

## 2023-02-06 ENCOUNTER — Ambulatory Visit: Payer: Medicare Other | Admitting: Skilled Nursing Facility1

## 2023-02-10 DIAGNOSIS — H2513 Age-related nuclear cataract, bilateral: Secondary | ICD-10-CM | POA: Diagnosis not present

## 2023-02-10 DIAGNOSIS — H401132 Primary open-angle glaucoma, bilateral, moderate stage: Secondary | ICD-10-CM | POA: Diagnosis not present

## 2023-03-09 ENCOUNTER — Other Ambulatory Visit: Payer: Self-pay

## 2023-03-09 DIAGNOSIS — E119 Type 2 diabetes mellitus without complications: Secondary | ICD-10-CM

## 2023-03-09 DIAGNOSIS — E785 Hyperlipidemia, unspecified: Secondary | ICD-10-CM

## 2023-03-09 DIAGNOSIS — I10 Essential (primary) hypertension: Secondary | ICD-10-CM

## 2023-03-10 ENCOUNTER — Other Ambulatory Visit: Payer: Medicare Other

## 2023-03-10 DIAGNOSIS — I10 Essential (primary) hypertension: Secondary | ICD-10-CM | POA: Diagnosis not present

## 2023-03-10 DIAGNOSIS — E119 Type 2 diabetes mellitus without complications: Secondary | ICD-10-CM | POA: Diagnosis not present

## 2023-03-10 DIAGNOSIS — E785 Hyperlipidemia, unspecified: Secondary | ICD-10-CM | POA: Diagnosis not present

## 2023-03-10 DIAGNOSIS — E1169 Type 2 diabetes mellitus with other specified complication: Secondary | ICD-10-CM | POA: Diagnosis not present

## 2023-03-11 LAB — HEMOGLOBIN A1C
Hgb A1c MFr Bld: 6.5 % of total Hgb — ABNORMAL HIGH (ref <5.7)
Mean Plasma Glucose: 140 mg/dL
eAG (mmol/L): 7.7 mmol/L

## 2023-03-11 LAB — CBC WITH DIFFERENTIAL/PLATELET
Absolute Monocytes: 439 cells/uL (ref 200–950)
Basophils Absolute: 29 cells/uL (ref 0–200)
Basophils Relative: 0.5 %
Eosinophils Absolute: 97 cells/uL (ref 15–500)
Eosinophils Relative: 1.7 %
HCT: 36.3 % (ref 35.0–45.0)
Hemoglobin: 12 g/dL (ref 11.7–15.5)
Lymphs Abs: 1841 cells/uL (ref 850–3900)
MCH: 29.1 pg (ref 27.0–33.0)
MCHC: 33.1 g/dL (ref 32.0–36.0)
MCV: 88.1 fL (ref 80.0–100.0)
MPV: 10 fL (ref 7.5–12.5)
Monocytes Relative: 7.7 %
Neutro Abs: 3295 cells/uL (ref 1500–7800)
Neutrophils Relative %: 57.8 %
Platelets: 281 10*3/uL (ref 140–400)
RBC: 4.12 10*6/uL (ref 3.80–5.10)
RDW: 13.1 % (ref 11.0–15.0)
Total Lymphocyte: 32.3 %
WBC: 5.7 10*3/uL (ref 3.8–10.8)

## 2023-03-11 LAB — LIPID PANEL
Cholesterol: 213 mg/dL — ABNORMAL HIGH (ref <200)
HDL: 63 mg/dL (ref >=50)
LDL Cholesterol (Calc): 131 mg/dL (calc) — ABNORMAL HIGH
Non-HDL Cholesterol (Calc): 150 mg/dL (calc) — ABNORMAL HIGH (ref <130)
Total CHOL/HDL Ratio: 3.4 (calc) (ref <5.0)
Triglycerides: 91 mg/dL (ref <150)

## 2023-03-11 LAB — COMPREHENSIVE METABOLIC PANEL
AG Ratio: 1.9 (calc) (ref 1.0–2.5)
ALT: 20 U/L (ref 6–29)
AST: 20 U/L (ref 10–35)
Albumin: 4.4 g/dL (ref 3.6–5.1)
Alkaline phosphatase (APISO): 73 U/L (ref 37–153)
BUN: 15 mg/dL (ref 7–25)
CO2: 28 mmol/L (ref 20–32)
Calcium: 9.4 mg/dL (ref 8.6–10.4)
Chloride: 103 mmol/L (ref 98–110)
Creat: 0.62 mg/dL (ref 0.60–1.00)
Globulin: 2.3 g/dL (calc) (ref 1.9–3.7)
Glucose, Bld: 115 mg/dL — ABNORMAL HIGH (ref 65–99)
Potassium: 3.4 mmol/L — ABNORMAL LOW (ref 3.5–5.3)
Sodium: 141 mmol/L (ref 135–146)
Total Bilirubin: 0.4 mg/dL (ref 0.2–1.2)
Total Protein: 6.7 g/dL (ref 6.1–8.1)

## 2023-03-11 LAB — TSH: TSH: 2.81 mIU/L (ref 0.40–4.50)

## 2023-03-19 ENCOUNTER — Other Ambulatory Visit: Payer: Self-pay | Admitting: Family Medicine

## 2023-03-19 ENCOUNTER — Ambulatory Visit (INDEPENDENT_AMBULATORY_CARE_PROVIDER_SITE_OTHER): Payer: Medicare Other | Admitting: Family Medicine

## 2023-03-19 ENCOUNTER — Encounter: Payer: Self-pay | Admitting: Family Medicine

## 2023-03-19 VITALS — BP 138/78 | HR 72 | Temp 98.2°F | Resp 17 | Ht 60.0 in | Wt 169.2 lb

## 2023-03-19 DIAGNOSIS — E1169 Type 2 diabetes mellitus with other specified complication: Secondary | ICD-10-CM

## 2023-03-19 DIAGNOSIS — E785 Hyperlipidemia, unspecified: Secondary | ICD-10-CM

## 2023-03-19 DIAGNOSIS — I10 Essential (primary) hypertension: Secondary | ICD-10-CM

## 2023-03-19 DIAGNOSIS — Z0000 Encounter for general adult medical examination without abnormal findings: Secondary | ICD-10-CM

## 2023-03-19 MED ORDER — ROSUVASTATIN CALCIUM 5 MG PO TABS
5.0000 mg | ORAL_TABLET | ORAL | 3 refills | Status: DC
Start: 2023-03-19 — End: 2024-03-11

## 2023-03-19 MED ORDER — VALSARTAN-HYDROCHLOROTHIAZIDE 320-25 MG PO TABS: 1.0000 | ORAL_TABLET | Freq: Every day | ORAL | 3 refills | Status: DC

## 2023-03-19 NOTE — Assessment & Plan Note (Signed)
Mildly elevated initial BP, repeat manual check improved - Home BP readings limited  No known complications  Off ACEi due to cough   Plan:  Continue Valsartan-hydrochlorothiazide 320-25mg  daily Encourage improved lifestyle - low sodium diet, regular exercise Continue monitor BP outside office, bring readings to next visit, if persistently >140/90 or new symptoms notify office sooner  Future consider add Amlodipine

## 2023-03-19 NOTE — Progress Notes (Signed)
Subjective:    Patient ID: Emily Hunt, female    DOB: [birth year], 72 y.o.   MRN: [id number]  Emily Hunt is a 72 y.o. female presenting on 03/19/2023 for Annual Exam   HPI  Here for Annual Physical and Lab Review  Mild Hypokalemia K 3.4, does eat veggies On Thiazide and ARB  CHRONIC HTN: Home readings controlled Off ACEI lisinopril due to cough Current Meds - Valsartan-HCTZ 320-25mg  daily She stopped calcium magnesium. Lifestyle: - Diet: Goal to limit sodium in diet more - Exercise: Walking and gym treadmill regularly, summertime would water aerobics Admits occasional RLE mild ankle edema. Denies CP, dyspnea, HA, dizziness / lightheadedness    CHRONIC DM, Type 2: A1c 6.3 on last lab. Not checking CBG Meds: None [facility] Dr Inez Pilgrim next apt in March 2024 She is mostly diet controlled. Less walking now but plans to improve Denies hypoglycemia, polyuria, visual changes, numbness or tingling.  HYPERLIPIDEMIA: - Reports no concerns. Last lipid panel 02/2023, elevated LDL 130s No prior statin therapy    Health Maintenance: Colon CA Screening: Completed Cologuard 09/26/22  Breast CA Screening prior mammogram normal, she will do self checks and monitor       03/19/2023    9:29 AM 11/21/2022   11:34 AM 08/12/2022   10:14 AM  Depression screen PHQ 2/9  Decreased Interest 0 0 0  Down, Depressed, Hopeless 0 1 0  PHQ - 2 Score 0 1 0  Altered sleeping 0 0 0  Tired, decreased energy 0 0 0  Change in appetite 0 0 0  Feeling bad or failure about yourself  0 0 0  Trouble concentrating 0 0 0  Moving slowly or fidgety/restless 0 0 0  Suicidal thoughts 0 0 0  PHQ-9 Score 0 1 0  Difficult doing work/chores Not difficult at all Not difficult at all Not difficult at all    Past Medical History:  Diagnosis Date   Glaucoma    No past surgical history on file. Social History   Socioeconomic History   Marital status: Widowed    Spouse name: Not on  file   Number of children: Not on file   Years of education: Not on file   Highest education level: Not on file  Occupational History   Not on file  Tobacco Use   Smoking status: Never   Smokeless tobacco: Never  Vaping Use   Vaping Use: Never used  Substance and Sexual Activity   Alcohol use: Never   Drug use: Never   Sexual activity: Not on file  Other Topics Concern   Not on file  Social History Narrative   Not on file   Social Determinants of Health   Financial Resource Strain: Low Risk  (11/21/2022)   Overall Financial Resource Strain (CARDIA)    Difficulty of Paying Living Expenses: Not hard at all  Food Insecurity: No Food Insecurity (11/21/2022)   Hunger Vital Sign    Worried About Running Out of Food in the Last Year: Never true    Ran Out of Food in the Last Year: Never true  Transportation Needs: No Transportation Needs (11/21/2022)   PRAPARE - Administrator, Civil Service (Medical): No    Lack of Transportation (Non-Medical): No  Physical Activity: Insufficiently Active (11/21/2022)   Exercise Vital Sign    Days of Exercise per Week: 2 days    Minutes of Exercise per Session: 20 min  Stress: No Stress Concern Present (  11/21/2022)   [place] [employer] - Occupational Stress Questionnaire    Feeling of Stress : Only a little  Social Connections: Moderately Isolated (11/21/2022)   Social Connection and Isolation Panel [NHANES]    Frequency of Communication with Friends and Family: More than three times a week    Frequency of Social Gatherings with Friends and Family: More than three times a week    Attends Religious Services: More than 4 times per year    Active Member of [employer] or Organizations: No    Attends Banker Meetings: Never    Marital Status: Widowed  Intimate Partner Violence: Not At Risk (11/21/2022)   Humiliation, Afraid, Rape, and Kick questionnaire    Fear of Current or Ex-Partner: No    Emotionally Abused: No     Physically Abused: No    Sexually Abused: No   No family history on file. Current Outpatient Medications on File Prior to Visit  Medication Sig   b complex vitamins capsule Take 1 capsule by mouth daily.   dorzolamide-timolol (COSOPT) 2-0.5 % ophthalmic solution 1 drop 2 (two) times daily.   fexofenadine (ALLEGRA) 180 MG tablet Take 180 mg by mouth daily.   latanoprost (XALATAN) 0.005 % ophthalmic solution Place 1 drop into both eyes at bedtime.   Multiple Vitamin (MULTIVITAMIN) tablet Take 1 tablet by mouth daily.   CALCIUM MAGNESIUM ZINC PO Take by mouth. (Patient not taking: Reported on 03/19/2023)   No current facility-administered medications on file prior to visit.    Review of Systems  Constitutional:  Negative for activity change, appetite change, chills, diaphoresis, fatigue and fever.  HENT:  Negative for congestion and hearing loss.   Eyes:  Negative for visual disturbance.  Respiratory:  Negative for cough, chest tightness, shortness of breath and wheezing.   Cardiovascular:  Negative for chest pain, palpitations and leg swelling.  Gastrointestinal:  Negative for abdominal pain, constipation, diarrhea, nausea and vomiting.  Genitourinary:  Negative for dysuria, frequency and hematuria.  Musculoskeletal:  Negative for arthralgias and neck pain.  Skin:  Negative for rash.  Neurological:  Negative for dizziness, weakness, light-headedness, numbness and headaches.  Hematological:  Negative for adenopathy.  Psychiatric/Behavioral:  Negative for behavioral problems, dysphoric mood and sleep disturbance.    Per HPI unless specifically indicated above      Objective:    BP 138/78 (BP Location: Left Arm, Cuff Size: Normal)   Pulse 72   Temp 98.2 F (36.8 C) (Oral)   Resp 17   Ht 5' (1.524 m)   Wt 169 lb 3.2 oz (76.7 kg)   SpO2 96%   BMI 33.04 kg/m   Wt Readings from Last 3 Encounters:  03/19/23 169 lb 3.2 oz (76.7 kg)  11/21/22 164 lb (74.4 kg)  09/12/22 164 lb  (74.4 kg)    Physical Exam Vitals and nursing note reviewed.  Constitutional:      General: She is not in acute distress.    Appearance: She is well-developed. She is obese. She is not diaphoretic.     Comments: Well-appearing, comfortable, cooperative  HENT:     Head: Normocephalic and atraumatic.  Eyes:     General:        Right eye: No discharge.        Left eye: No discharge.     Conjunctiva/sclera: Conjunctivae normal.     Pupils: Pupils are equal, round, and reactive to light.  Neck:     Thyroid: No thyromegaly.  Vascular: No carotid bruit.  Cardiovascular:     Rate and Rhythm: Normal rate and regular rhythm.     Pulses: Normal pulses.     Heart sounds: Normal heart sounds. No murmur heard. Pulmonary:     Effort: Pulmonary effort is normal. No respiratory distress.     Breath sounds: Normal breath sounds. No wheezing or rales.  Abdominal:     General: Bowel sounds are normal. There is no distension.     Palpations: Abdomen is soft. There is no mass.     Tenderness: There is no abdominal tenderness.  Musculoskeletal:        General: No tenderness. Normal range of motion.     Cervical back: Normal range of motion and neck supple.     Right lower leg: No edema.     Left lower leg: No edema.     Comments: Upper / Lower Extremities: - Normal muscle tone, strength bilateral upper extremities 5/5, lower extremities 5/5  Lymphadenopathy:     Cervical: No cervical adenopathy.  Skin:    General: Skin is warm and dry.     Findings: No erythema or rash.  Neurological:     Mental Status: She is alert and oriented to person, place, and time.     Comments: Distal sensation intact to light touch all extremities  Psychiatric:        Mood and Affect: Mood normal.        Behavior: Behavior normal.        Thought Content: Thought content normal.     Comments: Well groomed, good eye contact, normal speech and thoughts    Recent Labs    09/09/22 0759 10/28/22 0809  03/10/23 0841  HGBA1C 6.3* 6.5* 6.5*      Results for orders placed or performed in visit on 03/09/23  CBC with Differential/Platelet  Result Value Ref Range   WBC 5.7 3.8 - 10.8 Thousand/uL   RBC 4.12 3.80 - 5.10 Million/uL   Hemoglobin 12.0 11.7 - 15.5 g/dL   HCT 16.1 09.6 - 04.5 %   MCV 88.1 80.0 - 100.0 fL   MCH 29.1 27.0 - 33.0 pg   MCHC 33.1 32.0 - 36.0 g/dL   RDW 40.9 81.1 - 91.4 %   Platelets 281 140 - 400 Thousand/uL   MPV 10.0 7.5 - 12.5 fL   Neutro Abs 3,295 1,500 - 7,800 cells/uL   Lymphs Abs 1,841 850 - 3,900 cells/uL   Absolute Monocytes 439 200 - 950 cells/uL   Eosinophils Absolute 97 15 - 500 cells/uL   Basophils Absolute 29 0 - 200 cells/uL   Neutrophils Relative % 57.8 %   Total Lymphocyte 32.3 %   Monocytes Relative 7.7 %   Eosinophils Relative 1.7 %   Basophils Relative 0.5 %  Lipid panel  Result Value Ref Range   Cholesterol 213 (H) <200 mg/dL   HDL 63 > OR = 50 mg/dL   Triglycerides 91 <782 mg/dL   LDL Cholesterol (Calc) 131 (H) mg/dL (calc)   Total CHOL/HDL Ratio 3.4 <5.0 (calc)   Non-HDL Cholesterol (Calc) 150 (H) <130 mg/dL (calc)  Comprehensive metabolic panel  Result Value Ref Range   Glucose, Bld 115 (H) 65 - 99 mg/dL   BUN 15 7 - 25 mg/dL   Creat 9.56 2.13 - 0.86 mg/dL   BUN/Creatinine Ratio SEE NOTE: 6 - 22 (calc)   Sodium 141 135 - 146 mmol/L   Potassium 3.4 (L) 3.5 - 5.3 mmol/L  Chloride 103 98 - 110 mmol/L   CO2 28 20 - 32 mmol/L   Calcium 9.4 8.6 - 10.4 mg/dL   Total Protein 6.7 6.1 - 8.1 g/dL   Albumin 4.4 3.6 - 5.1 g/dL   Globulin 2.3 1.9 - 3.7 g/dL (calc)   AG Ratio 1.9 1.0 - 2.5 (calc)   Total Bilirubin 0.4 0.2 - 1.2 mg/dL   Alkaline phosphatase (APISO) 73 37 - 153 U/L   AST 20 10 - 35 U/L   ALT 20 6 - 29 U/L  TSH  Result Value Ref Range   TSH 2.81 0.40 - 4.50 mIU/L  Hemoglobin A1c  Result Value Ref Range   Hgb A1c MFr Bld 6.5 (H) <5.7 % of total Hgb   Mean Plasma Glucose 140 mg/dL   eAG (mmol/L) 7.7 mmol/L       Assessment & Plan:   Problem List Items Addressed This Visit     Essential hypertension    Mildly elevated initial BP, repeat manual check improved - Home BP readings limited  No known complications  Off ACEi due to cough   Plan:  Continue Valsartan-hydrochlorothiazide 320-25mg  daily Encourage improved lifestyle - low sodium diet, regular exercise Continue monitor BP outside office, bring readings to next visit, if persistently >140/90 or new symptoms notify office sooner  Future consider add Amlodipine      Relevant Medications   rosuvastatin (CRESTOR) 5 MG tablet   valsartan-hydrochlorothiazide (DIOVAN-HCT) 320-25 MG tablet   Hyperlipidemia associated with type 2 diabetes mellitus (HCC)    Elevated LDL 130s The 10-year ASCVD risk score (Arnett DK, et al., 2019) is: 36.2%  Plan: New start Statin therapy Rosuvastatin 5mg  every other day dosing Counseling on dosing side effects benefits Encourage improved lifestyle - low carb/cholesterol, reduce portion size, continue improving regular exercise  Repeat labs 3 months      Relevant Medications   rosuvastatin (CRESTOR) 5 MG tablet   valsartan-hydrochlorothiazide (DIOVAN-HCT) 320-25 MG tablet   Type 2 diabetes mellitus with other specified complication (HCC)    Well-controlled DM with A1c 6.5, stable from previous Complications - HYPERTENSION, HLD  Plan:  1. Remain diet controlled, medication free 2. Encourage improved lifestyle - low carb, low sugar diet, reduce portion size, continue improving regular exercise 3. Check CBG , bring log to next visit for review 4. Continue ARB now off ACEi 5. NEW START Statin therapy Rosuvastatin 5mg  every other day, check labs in 3 months       Relevant Medications   rosuvastatin (CRESTOR) 5 MG tablet   valsartan-hydrochlorothiazide (DIOVAN-HCT) 320-25 MG tablet   Other Visit Diagnoses     Annual physical exam    -  Primary       Updated Health Maintenance  information Reviewed recent lab results with patient Encouraged improvement to lifestyle with diet and exercise Goal of weight loss   Meds ordered this encounter  Medications   rosuvastatin (CRESTOR) 5 MG tablet    Sig: Take 1 tablet (5 mg total) by mouth every other day. Bedtime.    Dispense:  45 tablet    Refill:  3   valsartan-hydrochlorothiazide (DIOVAN-HCT) 320-25 MG tablet    Sig: Take 1 tablet by mouth daily.    Dispense:  90 tablet    Refill:  3      Follow up plan: Return in about 3 months (around 06/19/2023) for 3 month fasting lab only then 1 week later Follow-up HLD, HTN, DM updates.  Future labs CMET + A1c +  Lipid  Saralyn Pilar, DO [facility] 03/19/2023, 9:35 AM

## 2023-03-19 NOTE — Assessment & Plan Note (Signed)
Elevated LDL 130s The 10-year ASCVD risk score (Arnett DK, et al., 2019) is: 36.2%  Plan: New start Statin therapy Rosuvastatin 5mg  every other day dosing Counseling on dosing side effects benefits Encourage improved lifestyle - low carb/cholesterol, reduce portion size, continue improving regular exercise  Repeat labs 3 months

## 2023-03-19 NOTE — Assessment & Plan Note (Signed)
Well-controlled DM with A1c 6.5, stable from previous Complications - HYPERTENSION, HLD  Plan:  1. Remain diet controlled, medication free 2. Encourage improved lifestyle - low carb, low sugar diet, reduce portion size, continue improving regular exercise 3. Check CBG , bring log to next visit for review 4. Continue ARB now off ACEi 5. NEW START Statin therapy Rosuvastatin 5mg  every other day, check labs in 3 months

## 2023-03-19 NOTE — Patient Instructions (Addendum)
Thank you for coming to the office today.  Mild elevated cholesterol  You are at increased risk of future Cardiovascular complications such as Heart Attack or Stroke from an artery blockage due to abnormal cholesterol and/or risk factors. - As discussed, Statin Cholesterol pills both can both LOWER cholesterol and REDUCE this future risk of heart attack and stroke - Start Rosuvastatin (generic Crestor) 5mg  pill once at bedtime EVERY OTHER night  If you develop mild aches or pains in muscle or joint that does NOT improve or go away after first 3-4 weeks then this may require [place] to adjust the dose. First I would recommend STOPPING the medication for a few weeks until your ache and pain symptoms completely RESOLVE. Then you can restart at a LOWER DOSE either HALF a pill at bedtime every night or LESS OFTEN such as one pill a week only and then gradually increase to every other day or max dose of 3 times a week  Lastly, sometimes we need to try other versions of this medicine to find one that works for you and does not cause side effects.   Lipid Panel     Component Value Date/Time   CHOL 213 (H) 03/10/2023 0841   TRIG 91 03/10/2023 0841   HDL 63 03/10/2023 0841   CHOLHDL 3.4 03/10/2023 0841   LDLCALC 131 (H) 03/10/2023 0841   -------------  Recent Labs    09/09/22 0759 10/28/22 0809 03/10/23 0841  HGBA1C 6.3* 6.5* 6.5*   Sugar is excellent, it is well controlled  Slightly low potassium. Try to eat more veggies fruits with potassium. No extra med needed   Potassium Content of Foods  Potassium is a mineral found in many foods and drinks. It can affect how the heart works, affect blood pressure, and keep fluids and electrolytes balanced in the body. It is important not to have too much potassium (hyperkalemia) or too little potassium (hypokalemia) in the body, especially in the blood. Potassium is naturally found in many different types of whole foods, such as fruits, vegetables,  meat, and dairy products. Processed foods tend to be lower in potassium. The amount of potassium you need each day depends on your age and any medical conditions you may have. General recommendations are: Females aged 21 and older: 2,600 mg per day. Males aged 18 and older: 3,400 mg per day. Talk with your health care provider or dietitian about how much potassium you need. What foods are high in potassium? Below are examples of foods that have greater than 200 mg of potassium per serving. Fruits Orange -- 1 medium (130 g) has 230 mg of potassium. Banana -- 1 medium (120 g) has 420 mg of potassium. Cantaloupe, chunks -- 1 cup (160 g) has 430 mg of potassium. Vegetables Potato, baked, without skin -- 1 medium (170 g) has 600 mg of potassium. Broccoli, chopped, cooked --  cup (77.5 g) has 230 mg of potassium. Tomato, chopped or sliced -- 1 cup (152 g) has 400 mg of potassium. Grains Cereal, bran with raisins -- 1 cup (59 g) has 360 mg of potassium. Granola with almonds --  cup (82 g) has 220 mg of potassium. Meats and other proteins Ground beef patty -- 4 ounces (113 g) has 240 mg of potassium. Kidney beans, boiled --  cup (130 g) has 350 mg of potassium. Almonds -- 1 ounce (approximately 22 nuts or 28 g) has 200 mg of potassium. Dairy Cow's milk, 1% -- 1 cup (237 mL) has  360 mg of potassium. Plain vanilla low-fat yogurt --  cup (184 g) has 220 mg of potassium. The items listed above may not be a complete list of foods high in potassium. Actual amounts of potassium may be different depending on ripeness, shelf life, and food preparation. Contact a dietitian for more information. What foods are low in potassium? Below are examples of foods that have less than 200 mg of potassium per serving. Fruits Blueberries -- 1 cup (145 g) has 110 mg of potassium. Apple -- 1 medium (140 g) has 145 mg of potassium. Grapes -- 1 cup (160 g) has 175 mg of potassium. Vegetables Cabbage, raw -- 1 cup  (70 g) has 120 mg of potassium. Cauliflower, chopped, cooked -- 1 cup (180 g) has 90 mg of potassium. Romaine lettuce, chopped -- 1 cup (56 g) has 120 mg of potassium. Grains Bagel, plain -- one 4-inch (10 cm) has 100 mg of potassium. Whole wheat bread -- 1 slice (26 g) has 70 mg of potassium. White rice, cooked -- 1 cup (163 g) has 50 mg of potassium. Meats and other proteins Tuna, light, canned in water -- 3 ounces (85 g) has 150 mg of potassium. Egg, fried -- 1 large (50 g) has 60 mg of potassium. Peanuts --1 ounce (35 nuts or 28 g) has 180 mg of potassium. Tofu --  cup (252 g) has 150 mg of potassium. Dairy Cheese (cheddar, colby, mozzarella, or provolone) -- 1 ounce (28 g) has 30 to 40 mg of potassium. The items listed above may not be a complete list of foods that are low in potassium. Actual amounts of potassium may be different depending on ripeness, shelf life, and food preparation. Contact a dietitian for more information. Summary Potassium is a mineral found in many foods and drinks. It affects how the heart works, affects blood pressure, and keeps fluids and electrolytes balanced in the body. The amount of potassium you need each day depends on your age and any existing medical conditions you may have. Your health care provider or dietitian may recommend an amount of potassium that you should have each day. This information is not intended to replace advice given to you by your health care provider. Make sure you discuss any questions you have with your health care provider. Document Revised: 06/11/2021 Document Reviewed: 05/23/2021 Elsevier Patient Education  2024 Elsevier Inc.   DUE for FASTING BLOOD WORK (no food or drink after midnight before the lab appointment, only water or coffee without cream/sugar on the morning of)  SCHEDULE "Lab Only" visit in the morning at the clinic for lab draw in 3 MONTHS   - Make sure Lab Only appointment is at about 1 week before your next  appointment, so that results will be available  For Lab Results, once available within 2-3 days of blood draw, you can can log in to MyChart online to view your results and a brief explanation. Also, we can discuss results at next follow-up visit.    Please schedule a Follow-up Appointment to: Return in about 3 months (around 06/19/2023) for 3 month fasting lab only then 1 week later Follow-up HLD, HTN, DM updates.  If you have any other questions or concerns, please feel free to call the office or send a message through MyChart. You may also schedule an earlier appointment if necessary.  Additionally, you may be receiving a survey about your experience at our office within a few days to 1 week by e-mail or  mail. We value your feedback.  Emily Putnam, DO [place]

## 2023-04-24 ENCOUNTER — Ambulatory Visit: Payer: Medicare Other | Admitting: Pharmacist

## 2023-04-24 DIAGNOSIS — I10 Essential (primary) hypertension: Secondary | ICD-10-CM

## 2023-04-24 DIAGNOSIS — E1169 Type 2 diabetes mellitus with other specified complication: Secondary | ICD-10-CM

## 2023-04-24 NOTE — Patient Instructions (Signed)
Goals Addressed             This Visit's Progress    Pharmacy Goals       Check your blood pressure once to twice weekly, and any time you have concerning symptoms like headache, chest pain, dizziness, shortness of breath, or vision changes.   Our goal is less than 130/80.  To appropriately check your blood pressure, make sure you do the following:  1) Avoid caffeine, exercise, or tobacco products for 30 minutes before checking. Empty your bladder. 2) Sit with your back supported in a flat-backed chair. Rest your arm on something flat (arm of the chair, table, etc). 3) Sit still with your feet flat on the floor, resting, for at least 5 minutes.  4) Check your blood pressure. Take 1-2 readings.  5) Write down these readings and bring with you to any provider appointments.  Bring your home blood pressure machine with you to a provider's office for accuracy comparison at least once a year.   Make sure you take your blood pressure medications before you come to any office visit, even if you were asked to fast for labs.   Estelle Grumbles, PharmD, [place], CPP Clinical Pharmacist [facility] [phone]

## 2023-04-24 NOTE — Progress Notes (Signed)
04/24/2023 Name: Emily Hunt MRN: [id number] DOB: 1951-07-28  Chief Complaint  Patient presents with   Medication Management   Medication Adherence    Emily Hunt is a 72 y.o. year old female who presented for a telephone visit.   They were referred to the pharmacist by their PCP for assistance in managing diabetes and hypertension.      Subjective:   Care Team: Primary Care Provider: Smitty Cords, DO   Medication Access/Adherence  Current Pharmacy:  [facility]ORE [phone] [facility] [place] - [address] AT [facility] [address] [place] [address] Phone: [phone] Fax: [phone]   Patient reports affordability concerns with their medications: No  Patient reports access/transportation concerns to their pharmacy: No  Patient reports adherence concerns with their medications:  No       Hypertension:   Current medications: valsartan-HCTZ 320-25 mg - 1 tablets daily in morning Medications previously tried: lisinopril-HCTZ (cough)    Patient has a validated, automated, upper arm home BP cuff Denies checking recently. Recalls last checked ~1 month ago, reading ~130/70s    Patient denies hypotensive s/sx including dizziness, lightheadedness.    Caffeine: rarely drinks caffeine    Reports limits salt/sodium in her diet. Uses salt-free seasoning   Current physical activity: reports limited recently, but plans to start walk weekly with brother or a friend at the mall   Hyperlipidemia/ASCVD Risk Reduction  Current lipid lowering medications: rosuvastatin 5 mg every other day (reports tolerating by taking every other day as if takes more frequently, has muscle cramps)  Current physical activity: reports limited recently, but plans to start walk weekly with brother or a friend at the mall   Objective:  Lab Results  Component Value Date   HGBA1C 6.5 (H) 03/10/2023    Lab Results  Component Value Date    CREATININE 0.62 03/10/2023   BUN 15 03/10/2023   NA 141 03/10/2023   K 3.4 (L) 03/10/2023   CL 103 03/10/2023   CO2 28 03/10/2023    Lab Results  Component Value Date   CHOL 213 (H) 03/10/2023   HDL 63 03/10/2023   LDLCALC 131 (H) 03/10/2023   TRIG 91 03/10/2023   CHOLHDL 3.4 03/10/2023   BP Readings from Last 3 Encounters:  03/19/23 138/78  09/12/22 (!) 148/90  08/12/22 (!) 160/90   Pulse Readings from Last 3 Encounters:  03/19/23 72  09/12/22 80  08/12/22 71     Medications Reviewed Today     Reviewed by Manuela Neptune, RPH-CPP (Pharmacist) on 04/24/23 at 1113  Med List Status: <None>   Medication Order Taking? Sig Documenting Provider Last Dose Status Informant  b complex vitamins capsule [id number]  Take 1 capsule by mouth daily. [provider]  Active   CALCIUM MAGNESIUM ZINC PO [id number]  Take by mouth.  Patient not taking: Reported on 03/19/2023   [provider]  Active   dorzolamide-timolol (COSOPT) 2-0.5 % ophthalmic solution [id number]  1 drop 2 (two) times daily. [provider]  Active   fexofenadine (ALLEGRA) 180 MG tablet [id number]  Take 180 mg by mouth daily. [provider]  Active   latanoprost (XALATAN) 0.005 % ophthalmic solution [id number]  Place 1 drop into both eyes at bedtime. [provider]  Active   Multiple Vitamin (MULTIVITAMIN) tablet [id number]  Take 1 tablet by mouth daily. [provider]  Active Self  rosuvastatin (CRESTOR) 5 MG tablet  [id number] Yes Take 1 tablet (5 mg total) by mouth every other day. Bedtime. Smitty Cords, DO Taking Active   valsartan-hydrochlorothiazide (DIOVAN-HCT) 320-25 MG tablet [id number] Yes Take 1 tablet by mouth daily. Smitty Cords, DO Taking Active               Assessment/Plan:   Encourage patient to start using weekly pillbox to manage her medications  Hypertension: - Currently controlled - Reviewed appropriate  administration of medication regimen - Counseled on long term microvascular and macrovascular complications of uncontrolled hypertension - Reviewed appropriate home BP monitoring technique (avoid caffeine and exercise for 30 minutes before checking, rest for at least 5 minutes before taking BP, sit with feet flat on the floor and back against a hard surface, uncross legs, and rest arm on flat surface) - Encourage patient to increase her physical activity. - Counsel patient to check blood pressure once to twice weekly, document, have to provide at next visit, but to contact office sooner for readings outside of established parameters or for any dizziness or other symptoms  Follow Up Plan:   Patient denies further medication questions or concerns today Provide patient with contact information for clinic pharmacist to contact if needed in future for medication questions/concerns  Estelle Grumbles, PharmD, Patsy [place], CPP Clinical Pharmacist [facility] [phone]

## 2023-04-28 ENCOUNTER — Telehealth: Payer: Self-pay

## 2023-04-28 NOTE — Progress Notes (Signed)
Patient called stating that she was having some dizziness and had some questions about her meds.  Thank you, [name], RMA Care Guide [facility]  [place], [place] [address] Direct Dial: [phone] [email or web address]

## 2023-04-29 ENCOUNTER — Ambulatory Visit: Payer: Medicare Other | Admitting: Pharmacist

## 2023-04-29 DIAGNOSIS — I10 Essential (primary) hypertension: Secondary | ICD-10-CM

## 2023-04-29 NOTE — Patient Instructions (Signed)
Goals Addressed             This Visit's Progress    Pharmacy Goals       Check your blood pressure once to twice weekly, and any time you have concerning symptoms like headache, chest pain, dizziness, shortness of breath, or vision changes.   Our goal is less than 130/80.  To appropriately check your blood pressure, make sure you do the following:  1) Avoid caffeine, exercise, or tobacco products for 30 minutes before checking. Empty your bladder. 2) Sit with your back supported in a flat-backed chair. Rest your arm on something flat (arm of the chair, table, etc). 3) Sit still with your feet flat on the floor, resting, for at least 5 minutes.  4) Check your blood pressure. Take 1-2 readings.  5) Write down these readings and bring with you to any provider appointments.  Bring your home blood pressure machine with you to a provider's office for accuracy comparison at least once a year.   Make sure you take your blood pressure medications before you come to any office visit, even if you were asked to fast for labs.  Estelle Grumbles, PharmD, Patsy [place], CPP Clinical Pharmacist [facility] [phone]

## 2023-04-29 NOTE — Progress Notes (Signed)
04/29/2023 Name: Emily Hunt MRN: [id number] DOB: 1951/07/31  Chief Complaint  Patient presents with   Medication Management    Emily Hunt is a 72 y.o. year old female who was referred to the pharmacist by their PCP for assistance in managing hypertension.   Today receive a message from Care Guide [name] advising that she received a call from patient stating that she was having some dizziness and had some questions about her medications.  Return call to patient today.   Subjective:  Care Team: Primary Care Provider: Smitty Cords, DO ; Next Scheduled Visit: 06/23/2023  Medication Access/Adherence  Current Pharmacy:  [facility] #[id number] [place] - [address] AT [facility] [address] [place] [address] Phone: [phone] Fax: [phone]   Patient reports affordability concerns with their medications: No  Patient reports access/transportation concerns to their pharmacy: No  Patient reports adherence concerns with their medications:  No    Reports had an episode of dizziness on Sunday morning after she bent down to pick something up off of the floor. Reports rested and sipped on a drink with electrolytes and dizziness resolved. Notes that she had been out in the heat the day before. - Denies hypotension - Denies further symptoms since  Hypertension:   Current medications: valsartan-HCTZ 320-25 mg - 1 tablets daily in morning Medications previously tried: lisinopril-HCTZ (cough)    Patient has a validated, automated, upper arm home BP cuff Reports last checked home blood pressure on 8/4, recalls reading ~127/66   Objective:  Lab Results  Component Value Date   HGBA1C 6.5 (H) 03/10/2023    Lab Results  Component Value Date   CREATININE 0.62 03/10/2023   BUN 15 03/10/2023   NA 141 03/10/2023   K 3.4 (L) 03/10/2023   CL 103 03/10/2023   CO2 28 03/10/2023    Lab Results  Component Value Date    CHOL 213 (H) 03/10/2023   HDL 63 03/10/2023   LDLCALC 131 (H) 03/10/2023   TRIG 91 03/10/2023   CHOLHDL 3.4 03/10/2023   BP Readings from Last 3 Encounters:  03/19/23 138/78  09/12/22 (!) 148/90  08/12/22 (!) 160/90   Pulse Readings from Last 3 Encounters:  03/19/23 72  09/12/22 80  08/12/22 71     Medications Reviewed Today     Reviewed by Manuela Neptune, RPH-CPP (Pharmacist) on 04/29/23 at 2132  Med List Status: <None>   Medication Order Taking? Sig Documenting Provider Last Dose Status Informant  b complex vitamins capsule [id number] No Take 1 capsule by mouth daily. [provider] Taking Active   CALCIUM MAGNESIUM ZINC PO [id number] No Take by mouth.  Patient not taking: Reported on 03/19/2023   [provider] Not Taking Active   dorzolamide-timolol (COSOPT) 2-0.5 % ophthalmic solution [id number] No 1 drop 2 (two) times daily. [provider] Taking Active   fexofenadine (ALLEGRA) 180 MG tablet [id number] No Take 180 mg by mouth daily. [provider] Taking Active   latanoprost (XALATAN) 0.005 % ophthalmic solution [id number] No Place 1 drop into both eyes at bedtime. [provider] Taking Active   Multiple Vitamin (MULTIVITAMIN) tablet [id number] No Take 1 tablet by mouth daily. [provider] Taking Active Self  rosuvastatin (CRESTOR) 5 MG tablet [id number] No Take 1 tablet (5 mg total) by mouth every other day. Bedtime. Smitty Cords, DO Taking Active   valsartan-hydrochlorothiazide (DIOVAN-HCT) 320-25 MG  tablet [id number] No Take 1 tablet by mouth daily. Smitty Cords, DO Taking Active               Assessment/Plan:   Hypertension: - Currently controlled - Counseled on long term microvascular and macrovascular complications of uncontrolled hypertension - Encourage patient to stay hydrated - Encourage patient to take positional changes slowly - Counsel patient to check blood  pressure once to twice weekly, document, have to provide at next visit, but to contact office sooner for readings outside of established parameters or for any further symptoms of dizziness or other symptoms   Follow Up Plan:    Patient denies further medication questions or concerns today Provide patient with contact information for clinic pharmacist to contact if needed in future for medication questions/concerns   Estelle Grumbles, PharmD, Patsy [place], CPP Clinical Pharmacist [facility] [phone]

## 2023-06-08 ENCOUNTER — Ambulatory Visit: Payer: Self-pay | Admitting: *Deleted

## 2023-06-08 NOTE — Telephone Encounter (Signed)
I called patient.  3 possible options. This is not straightforward  Cramp is either from 1) Statin, 2) stopping magnesium or 3) hypokalemia on thiazide.  Recommend her to restart Statin, and Magnesium (twice a day) Since she stopped magnesium previously this may be the cause of cramping. She should be back on Statin so we can test this theory and prove it was not the statin.  2. If still cramping back on both, then it could be the Statin. She can stop the statin and see if cramping resolves or continues.  3. If still cramping off Statin. Then it is likely the Potassium / Thiazide. I would order K Supplement Rx daily to take.   She should follow up as scheduled  Saralyn Pilar, DO [facility] 06/08/2023, 4:37 PM

## 2023-06-08 NOTE — Telephone Encounter (Signed)
Summary: Thigh cramping Advice   Pt is calling to report L& R upper thigh are cramping. Is there anything that she can take for this?       Reason for Disposition  [1] Caused by muscle cramps in the thigh, calf, or foot AND [2] present < 1 hour (brief, now gone)  Answer Assessment - Initial Assessment Questions 1. ONSET: "When did the pain start?"      Stared Friday night R- then moved to both 2. LOCATION: "Where is the pain located?"      Painful cramping in thighs- started in R- then moved to L 3. PAIN: "How bad is the pain?"    (Scale 1-10; or mild, moderate, severe)   -  MILD (1-3): doesn't interfere with normal activities    -  MODERATE (4-7): interferes with normal activities (e.g., work or school) or awakens from sleep, limping    -  SEVERE (8-10): excruciating pain, unable to do any normal activities, unable to walk     No pain now- uncomfortable 4. WORK OR EXERCISE: "Has there been any recent work or exercise that involved this part of the body?"      no 5. CAUSE: "What do you think is causing the leg pain?"     Unsure- patient did stop her statin last night- every other night for a little while- never had cramping like this- does use mustard and electrolyte replacement 6. OTHER SYMPTOMS: "Do you have any other symptoms?" (e.g., chest pain, back pain, breathing difficulty, swelling, rash, fever, numbness, weakness)     none  Protocols used: Leg Pain-A-AH

## 2023-06-08 NOTE — Telephone Encounter (Signed)
  Chief Complaint: thigh cramps-bilateral Symptoms: cramping in thighs- did ease off Frequency: stared Friday night and into Saturday Pertinent Negatives: Patient denies chest pain, back pain, breathing difficulty, swelling, rash, fever, numbness, weakness  Disposition: [] ED /[] Urgent Care (no appt availability in office) / [] Appointment(In office/virtual)/ []  [facility]/ [x] Home Care/ [] Refused Recommended Disposition /[] [place] [facility]/ []  Follow-up with PCP Additional Notes: Patient is not sure what stared the cramping- she has not been overly active. Patient has increased hydration and ate mustard. She has tried electrolyte drink in past. Patient stopped her statin medication last night. Patient is not sure what caused her legs to cramp- but it has eased- she wants provider input on whether she should continue to stop statin- or does she need electrolytes checked.

## 2023-06-16 ENCOUNTER — Other Ambulatory Visit: Payer: Medicare Other

## 2023-06-16 DIAGNOSIS — E1169 Type 2 diabetes mellitus with other specified complication: Secondary | ICD-10-CM

## 2023-06-16 DIAGNOSIS — E785 Hyperlipidemia, unspecified: Secondary | ICD-10-CM

## 2023-06-17 DIAGNOSIS — E785 Hyperlipidemia, unspecified: Secondary | ICD-10-CM | POA: Diagnosis not present

## 2023-06-17 DIAGNOSIS — E1169 Type 2 diabetes mellitus with other specified complication: Secondary | ICD-10-CM | POA: Diagnosis not present

## 2023-06-18 LAB — COMPLETE METABOLIC PANEL WITH GFR
AG Ratio: 2 (calc) (ref 1.0–2.5)
ALT: 18 U/L (ref 6–29)
AST: 21 U/L (ref 10–35)
Albumin: 4.4 g/dL (ref 3.6–5.1)
Alkaline phosphatase (APISO): 75 U/L (ref 37–153)
BUN: 11 mg/dL (ref 7–25)
CO2: 28 mmol/L (ref 20–32)
Calcium: 9.5 mg/dL (ref 8.6–10.4)
Chloride: 98 mmol/L (ref 98–110)
Creat: 0.7 mg/dL (ref 0.60–1.00)
Globulin: 2.2 g/dL (calc) (ref 1.9–3.7)
Glucose, Bld: 104 mg/dL — ABNORMAL HIGH (ref 65–99)
Potassium: 4.3 mmol/L (ref 3.5–5.3)
Sodium: 136 mmol/L (ref 135–146)
Total Bilirubin: 0.4 mg/dL (ref 0.2–1.2)
Total Protein: 6.6 g/dL (ref 6.1–8.1)
eGFR: 91 mL/min/{1.73_m2} (ref >=60)

## 2023-06-18 LAB — LIPID PANEL
Cholesterol: 171 mg/dL (ref <200)
HDL: 66 mg/dL (ref >=50)
LDL Cholesterol (Calc): 87 mg/dL (calc)
Non-HDL Cholesterol (Calc): 105 mg/dL (calc) (ref <130)
Total CHOL/HDL Ratio: 2.6 (calc) (ref <5.0)
Triglycerides: 87 mg/dL (ref <150)

## 2023-06-18 LAB — HEMOGLOBIN A1C
Hgb A1c MFr Bld: 6.5 % of total Hgb — ABNORMAL HIGH (ref <5.7)
Mean Plasma Glucose: 140 mg/dL
eAG (mmol/L): 7.7 mmol/L

## 2023-06-23 ENCOUNTER — Other Ambulatory Visit: Payer: Self-pay | Admitting: Family Medicine

## 2023-06-23 ENCOUNTER — Ambulatory Visit (INDEPENDENT_AMBULATORY_CARE_PROVIDER_SITE_OTHER): Payer: Medicare Other | Admitting: Family Medicine

## 2023-06-23 VITALS — BP 132/80 | HR 64 | Ht 60.0 in | Wt 166.0 lb

## 2023-06-23 DIAGNOSIS — E1169 Type 2 diabetes mellitus with other specified complication: Secondary | ICD-10-CM

## 2023-06-23 DIAGNOSIS — E785 Hyperlipidemia, unspecified: Secondary | ICD-10-CM | POA: Diagnosis not present

## 2023-06-23 DIAGNOSIS — E66811 Obesity, class 1: Secondary | ICD-10-CM

## 2023-06-23 DIAGNOSIS — I10 Essential (primary) hypertension: Secondary | ICD-10-CM | POA: Diagnosis not present

## 2023-06-23 DIAGNOSIS — Z0000 Encounter for general adult medical examination without abnormal findings: Secondary | ICD-10-CM

## 2023-06-23 NOTE — Patient Instructions (Addendum)
Thank you for coming to the office today.  Recent Labs    10/28/22 0809 03/10/23 0841 06/17/23 0840  HGBA1C 6.5* 6.5* 6.5*   Sugar is controlled. No medicine required.  Continue Cholesterol med 5mg  every other night.  Cholesterol is controlled.  Blood pressure is in a good range. No new BP    IBGard OTC Peppermint Oil (Triple Coated Capsule) 180mg  take one 3 times daily to reduce diarrhea  Can reduce to 1 x daily once stable  - May try OTC Probiotic  - May take OTC Fiber supplement (metamucil powder or pill/gummy)    Please schedule a Follow-up Appointment to: No follow-ups on file.  If you have any other questions or concerns, please feel free to call the office or send a message through MyChart. You may also schedule an earlier appointment if necessary.  Additionally, you may be receiving a survey about your experience at our office within a few days to 1 week by e-mail or mail. We value your feedback.  Saralyn Pilar, DO [facility]  Heart-Healthy Eating Plan Many factors influence your heart health, including eating and exercise habits. Heart health is also called coronary health. Coronary risk increases with abnormal blood fat (lipid) levels. A heart-healthy eating plan includes limiting unhealthy fats, increasing healthy fats, limiting salt (sodium) intake, and making other diet and lifestyle changes. What is my plan? Your health care provider may recommend that: You limit your fat intake to _________% or less of your total calories each day. You limit your saturated fat intake to _________% or less of your total calories each day. You limit the amount of cholesterol in your diet to less than _________ mg per day. You limit the amount of sodium in your diet to less than _________ mg per day. What are tips for following this plan? Cooking Cook foods using methods other than frying. Baking, boiling, grilling, and broiling are all good  options. Other ways to reduce fat include: Removing the skin from poultry. Removing all visible fats from meats. Steaming vegetables in water or broth. Meal planning  At meals, imagine dividing your plate into fourths: Fill one-half of your plate with vegetables and green salads. Fill one-fourth of your plate with whole grains. Fill one-fourth of your plate with lean protein foods. Eat 2-4 cups of vegetables per day. One cup of vegetables equals 1 cup (91 g) broccoli or cauliflower florets, 2 medium carrots, 1 large bell pepper, 1 large sweet potato, 1 large tomato, 1 medium white potato, 2 cups (150 g) raw leafy greens. Eat 1-2 cups of fruit per day. One cup of fruit equals 1 small apple, 1 large banana, 1 cup (237 g) mixed fruit, 1 large orange,  cup (82 g) dried fruit, 1 cup (240 mL) 100% fruit juice. Eat more foods that contain soluble fiber. Examples include apples, broccoli, carrots, beans, peas, and barley. Aim to get 25-30 g of fiber per day. Increase your consumption of legumes, nuts, and seeds to 4-5 servings per week. One serving of dried beans or legumes equals  cup (90 g) cooked, 1 serving of nuts is  oz (12 almonds, 24 pistachios, or 7 walnut halves), and 1 serving of seeds equals  oz (8 g). Fats Choose healthy fats more often. Choose monounsaturated and polyunsaturated fats, such as olive and canola oils, avocado oil, flaxseeds, walnuts, almonds, and seeds. Eat more omega-3 fats. Choose salmon, mackerel, sardines, tuna, flaxseed oil, and ground flaxseeds. Aim to eat fish at  least 2 times each week. Check food labels carefully to identify foods with trans fats or high amounts of saturated fat. Limit saturated fats. These are found in animal products, such as meats, butter, and cream. Plant sources of saturated fats include palm oil, palm kernel oil, and coconut oil. Avoid foods with partially hydrogenated oils in them. These contain trans fats. Examples are stick margarine,  some tub margarines, cookies, crackers, and other baked goods. Avoid fried foods. General information Eat more home-cooked food and less restaurant, buffet, and fast food. Limit or avoid alcohol. Limit foods that are high in added sugar and simple starches such as foods made using white refined flour (white breads, pastries, sweets). Lose weight if you are overweight. Losing just 5-10% of your body weight can help your overall health and prevent diseases such as diabetes and heart disease. Monitor your sodium intake, especially if you have high blood pressure. Talk with your health care provider about your sodium intake. Try to incorporate more vegetarian meals weekly. What foods should I eat? Fruits All fresh, canned (in natural juice), or frozen fruits. Vegetables Fresh or frozen vegetables (raw, steamed, roasted, or grilled). Green salads. Grains Most grains. Choose whole wheat and whole grains most of the time. Rice and pasta, including brown rice and pastas made with whole wheat. Meats and other proteins Lean, well-trimmed beef, veal, pork, and lamb. Chicken and [place] without skin. All fish and shellfish. Wild duck, rabbit, pheasant, and venison. Egg whites or low-cholesterol egg substitutes. Dried beans, peas, lentils, and tofu. Seeds and most nuts. Dairy Low-fat or nonfat cheeses, including ricotta and mozzarella. Skim or 1% milk (liquid, powdered, or evaporated). Buttermilk made with low-fat milk. Nonfat or low-fat yogurt. Fats and oils Non-hydrogenated (trans-free) margarines. Vegetable oils, including soybean, sesame, sunflower, olive, avocado, peanut, safflower, corn, canola, and cottonseed. Salad dressings or mayonnaise made with a vegetable oil. Beverages Water (mineral or sparkling). Coffee and tea. Unsweetened ice tea. Diet beverages. Sweets and desserts Sherbet, gelatin, and fruit ice. Small amounts of dark chocolate. Limit all sweets and desserts. Seasonings and  condiments All seasonings and condiments. The items listed above may not be a complete list of foods and beverages you can eat. Contact a dietitian for more options. What foods should I avoid? Fruits Canned fruit in heavy syrup. Fruit in cream or butter sauce. Fried fruit. Limit coconut. Vegetables Vegetables cooked in cheese, cream, or butter sauce. Fried vegetables. Grains Breads made with saturated or trans fats, oils, or whole milk. Croissants. Sweet rolls. Donuts. High-fat crackers, such as cheese crackers and chips. Meats and other proteins Fatty meats, such as hot dogs, ribs, sausage, bacon, rib-eye roast or steak. High-fat deli meats, such as salami and bologna. Caviar. Domestic duck and goose. Organ meats, such as liver. Dairy Cream, sour cream, cream cheese, and creamed cottage cheese. Whole-milk cheeses. Whole or 2% milk (liquid, evaporated, or condensed). Whole buttermilk. Cream sauce or high-fat cheese sauce. Whole-milk yogurt. Fats and oils Meat fat, or shortening. Cocoa butter, hydrogenated oils, palm oil, coconut oil, palm kernel oil. Solid fats and shortenings, including bacon fat, salt pork, lard, and butter. Nondairy cream substitutes. Salad dressings with cheese or sour cream. Beverages Regular sodas and any drinks with added sugar. Sweets and desserts Frosting. Pudding. Cookies. Cakes. Pies. Milk chocolate or white chocolate. Buttered syrups. Full-fat ice cream or ice cream drinks. The items listed above may not be a complete list of foods and beverages to avoid. Contact a dietitian for more information. Summary  Heart-healthy meal planning includes limiting unhealthy fats, increasing healthy fats, limiting salt (sodium) intake and making other diet and lifestyle changes. Lose weight if you are overweight. Losing just 5-10% of your body weight can help your overall health and prevent diseases such as diabetes and heart disease. Focus on eating a balance of foods, including  fruits and vegetables, low-fat or nonfat dairy, lean protein, nuts and legumes, whole grains, and heart-healthy oils and fats. This information is not intended to replace advice given to you by your health care provider. Make sure you discuss any questions you have with your health care provider. Document Revised: 10/14/2021 Document Reviewed: 10/14/2021 Elsevier Patient Education  2024 [employer].

## 2023-06-23 NOTE — Assessment & Plan Note (Signed)
Improved controlled BP. No known complications  Off ACEi due to cough   Plan:  Continue Valsartan-hydrochlorothiazide 320-25mg  daily Encourage improved lifestyle - low sodium diet, regular exercise Continue monitor BP outside office, bring readings to next visit, if persistently >140/90 or new symptoms notify office sooner  Future consider add Amlodipine if indicated.

## 2023-06-23 NOTE — Progress Notes (Signed)
Subjective:    Patient ID: RUKIYA HODGKINS, female    DOB: 06/17/1951, 72 y.o.   MRN: [id number]  KYNISHA MEMON is a 72 y.o. female presenting on 06/23/2023 for Hyperlipidemia, Hypertension, and Diabetes   HPI  Discussed the use of AI scribe software for clinical note transcription with the patient, who gave verbal consent to proceed.   Mild Hypokalemia - resolved Potassium has resolved back to normal range 4.3 On Thiazide and ARB   CHRONIC HTN: Home readings controlled Off ACEI lisinopril due to cough Current Meds - Valsartan-HCTZ 320-25mg  daily Lifestyle: - Diet: Goal to limit sodium in diet more - Exercise: Walking and gym treadmill regularly, summertime would water aerobics Admits occasional RLE mild ankle edema. Denies CP, dyspnea, HA, dizziness / lightheadedness    CHRONIC DM, Type 2: A1c 6.5, stable and unchanged. Not checking CBG Meds: None [facility] next apt in March 2024 She is mostly diet controlled. Less walking now but plans to improve Denies hypoglycemia, polyuria, visual changes, numbness or tingling.   HYPERLIPIDEMIA: Since last visit 3 months ago, we switched to intermittent Statin dosing Rosuvastatin 5mg  every other night, and her cramping has improved or resolved, also using Calcium + Magnesium. Lipid panel result shows improved Total Cholesterol down from 213 to 171 and LDL down from 131 to 87. Continues Rosuvastatin 5mg  every other night  The patient also reports a diet primarily consisting of salads, beans, and chicken, which they believe may be contributing to increased bowel frequency and urgency. She describes their stools as soft but not loose. She expresses a desire to regulate their bowel movements and is considering the addition of dietary supplements, such as probiotics and fiber, to their regimen.    Health Maintenance:  Already had Flu Shot at pharmacy.  Colon CA Screening: Completed Cologuard 09/26/22   Breast  CA Screening prior mammogram normal, she will do self checks and monitor       06/23/2023    8:20 AM 03/19/2023    9:29 AM 11/21/2022   11:34 AM  Depression screen PHQ 2/9  Decreased Interest 0 0 0  Down, Depressed, Hopeless 0 0 1  PHQ - 2 Score 0 0 1  Altered sleeping 0 0 0  Tired, decreased energy 0 0 0  Change in appetite 0 0 0  Feeling bad or failure about yourself  0 0 0  Trouble concentrating 0 0 0  Moving slowly or fidgety/restless 0 0 0  Suicidal thoughts 0 0 0  PHQ-9 Score 0 0 1  Difficult doing work/chores  Not difficult at all Not difficult at all    Social History   Tobacco Use   Smoking status: Never   Smokeless tobacco: Never  Vaping Use   Vaping status: Never Used  Substance Use Topics   Alcohol use: Never   Drug use: Never    Review of Systems Per HPI unless specifically indicated above     Objective:    BP 132/80   Pulse 64   Ht 5' (1.524 m)   Wt 166 lb (75.3 kg)   SpO2 98%   BMI 32.42 kg/m   Wt Readings from Last 3 Encounters:  06/23/23 166 lb (75.3 kg)  03/19/23 169 lb 3.2 oz (76.7 kg)  11/21/22 164 lb (74.4 kg)    Physical Exam Vitals and nursing note reviewed.  Constitutional:      General: She is not in acute distress.    Appearance: Normal appearance.  She is well-developed. She is not diaphoretic.     Comments: Well-appearing, comfortable, cooperative  HENT:     Head: Normocephalic and atraumatic.  Eyes:     General:        Right eye: No discharge.        Left eye: No discharge.     Conjunctiva/sclera: Conjunctivae normal.  Cardiovascular:     Rate and Rhythm: Normal rate.  Pulmonary:     Effort: Pulmonary effort is normal.  Skin:    General: Skin is warm and dry.     Findings: No erythema or rash.  Neurological:     Mental Status: She is alert and oriented to person, place, and time.  Psychiatric:        Mood and Affect: Mood normal.        Behavior: Behavior normal.        Thought Content: Thought content normal.      Comments: Well groomed, good eye contact, normal speech and thoughts      Results for orders placed or performed in visit on 06/16/23  Lipid panel  Result Value Ref Range   Cholesterol 171 <200 mg/dL   HDL 66 > OR = 50 mg/dL   Triglycerides 87 <130 mg/dL   LDL Cholesterol (Calc) 87 mg/dL (calc)   Total CHOL/HDL Ratio 2.6 <5.0 (calc)   Non-HDL Cholesterol (Calc) 105 <130 mg/dL (calc)  Hemoglobin Q6V  Result Value Ref Range   Hgb A1c MFr Bld 6.5 (H) <5.7 % of total Hgb   Mean Plasma Glucose 140 mg/dL   eAG (mmol/L) 7.7 mmol/L  COMPLETE METABOLIC PANEL WITH GFR  Result Value Ref Range   Glucose, Bld 104 (H) 65 - 99 mg/dL   BUN 11 7 - 25 mg/dL   Creat 7.84 6.96 - 2.95 mg/dL   eGFR 91 > OR = 60 MW/UXL/2.44W1   BUN/Creatinine Ratio SEE NOTE: 6 - 22 (calc)   Sodium 136 135 - 146 mmol/L   Potassium 4.3 3.5 - 5.3 mmol/L   Chloride 98 98 - 110 mmol/L   CO2 28 20 - 32 mmol/L   Calcium 9.5 8.6 - 10.4 mg/dL   Total Protein 6.6 6.1 - 8.1 g/dL   Albumin 4.4 3.6 - 5.1 g/dL   Globulin 2.2 1.9 - 3.7 g/dL (calc)   AG Ratio 2.0 1.0 - 2.5 (calc)   Total Bilirubin 0.4 0.2 - 1.2 mg/dL   Alkaline phosphatase (APISO) 75 37 - 153 U/L   AST 21 10 - 35 U/L   ALT 18 6 - 29 U/L      Assessment & Plan:   Problem List Items Addressed This Visit     Essential hypertension - Primary    Improved controlled BP. No known complications  Off ACEi due to cough   Plan:  Continue Valsartan-hydrochlorothiazide 320-25mg  daily Encourage improved lifestyle - low sodium diet, regular exercise Continue monitor BP outside office, bring readings to next visit, if persistently >140/90 or new symptoms notify office sooner  Future consider add Amlodipine if indicated.      Hyperlipidemia associated with type 2 diabetes mellitus (HCC)    Improved LDL to 80s, from 130s, now on intermittent statin dosing. Unable to tolerate daily Statin due to myalgia The 10-year ASCVD risk score (Arnett DK, et al., 2019) is:  30.6%  Plan: Continue current intermittent Rosuvastatin 5mg  every other day dosing On Calcium/Mag as well for cramping Encourage improved lifestyle - low carb/cholesterol, reduce portion size, continue improving regular  exercise      Type 2 diabetes mellitus with other specified complication (HCC)    Well-controlled DM with A1c 6.5, stable from previous Complications - HYPERTENSION, HLD  Plan:  1. Remain diet controlled, medication free 2. Encourage improved lifestyle - low carb, low sugar diet, reduce portion size, continue improving regular exercise 3. Check CBG , bring log to next visit for review 4. Continue ARB now off ACEi 5. On Statin now          Frequent Bowel Movements Reports increased frequency of bowel movements, possibly related to high fiber diet. No loose stools or other concerning symptoms.  -Trial of over-the-counter peppermint oil capsules to help regulate bowel movements. -Consideration of probiotics and prebiotics for gut health.    No orders of the defined types were placed in this encounter.     Follow up plan: Return in about 9 months (around 03/22/2024) for 9 month fasting lab only then 1 week later Annual Physical (AM preferred).  Future labs ordered for 03/22/24  Saralyn Pilar, DO [facility] 06/23/2023, 8:49 AM

## 2023-06-23 NOTE — Assessment & Plan Note (Signed)
Improved LDL to 80s, from 130s, now on intermittent statin dosing. Unable to tolerate daily Statin due to myalgia The 10-year ASCVD risk score (Arnett DK, et al., 2019) is: 30.6%  Plan: Continue current intermittent Rosuvastatin 5mg  every other day dosing On Calcium/Mag as well for cramping Encourage improved lifestyle - low carb/cholesterol, reduce portion size, continue improving regular exercise

## 2023-06-23 NOTE — Assessment & Plan Note (Signed)
Well-controlled DM with A1c 6.5, stable from previous Complications - HYPERTENSION, HLD  Plan:  1. Remain diet controlled, medication free 2. Encourage improved lifestyle - low carb, low sugar diet, reduce portion size, continue improving regular exercise 3. Check CBG , bring log to next visit for review 4. Continue ARB now off ACEi 5. On Statin now

## 2023-07-06 ENCOUNTER — Encounter: Payer: Self-pay | Admitting: *Deleted

## 2023-07-06 NOTE — Progress Notes (Signed)
Please consider ordering a urine albumin/creatinine ratio lab test to assist in closing the KED gap Thanks

## 2023-07-08 ENCOUNTER — Other Ambulatory Visit: Payer: Self-pay

## 2023-07-08 DIAGNOSIS — I10 Essential (primary) hypertension: Secondary | ICD-10-CM

## 2023-07-08 DIAGNOSIS — E1169 Type 2 diabetes mellitus with other specified complication: Secondary | ICD-10-CM

## 2023-07-08 NOTE — Progress Notes (Signed)
Patient scheduled.

## 2023-07-10 ENCOUNTER — Other Ambulatory Visit: Payer: Medicare Other

## 2023-07-10 DIAGNOSIS — I10 Essential (primary) hypertension: Secondary | ICD-10-CM

## 2023-07-10 DIAGNOSIS — Z0000 Encounter for general adult medical examination without abnormal findings: Secondary | ICD-10-CM

## 2023-07-10 DIAGNOSIS — E1169 Type 2 diabetes mellitus with other specified complication: Secondary | ICD-10-CM

## 2023-07-10 DIAGNOSIS — E785 Hyperlipidemia, unspecified: Secondary | ICD-10-CM | POA: Diagnosis not present

## 2023-07-10 DIAGNOSIS — E66811 Obesity, class 1: Secondary | ICD-10-CM

## 2023-07-11 LAB — MICROALBUMIN / CREATININE URINE RATIO
Creatinine, Urine: 73 mg/dL (ref 20–275)
Microalb Creat Ratio: 11 mg/g{creat} (ref <30)
Microalb, Ur: 0.8 mg/dL

## 2023-07-17 DIAGNOSIS — H401132 Primary open-angle glaucoma, bilateral, moderate stage: Secondary | ICD-10-CM | POA: Diagnosis not present

## 2023-07-17 DIAGNOSIS — H2513 Age-related nuclear cataract, bilateral: Secondary | ICD-10-CM | POA: Diagnosis not present

## 2023-10-20 ENCOUNTER — Other Ambulatory Visit: Payer: Self-pay | Admitting: Family Medicine

## 2023-10-20 DIAGNOSIS — I10 Essential (primary) hypertension: Secondary | ICD-10-CM

## 2023-10-22 NOTE — Telephone Encounter (Signed)
Requested Prescriptions  Refused Prescriptions Disp Refills   valsartan-hydrochlorothiazide (DIOVAN-HCT) 320-25 MG tablet [Pharmacy Med Name: VALSARTAN/HCTZ 320MG /25MG  TABLETS] 90 tablet 3    Sig: TAKE 1 TABLET BY MOUTH DAILY     Cardiovascular: ARB + Diuretic Combos Passed - 10/22/2023 11:09 AM      Passed - K in normal range and within 180 days    Potassium  Date Value Ref Range Status  06/17/2023 4.3 3.5 - 5.3 mmol/L Final         Passed - Na in normal range and within 180 days    Sodium  Date Value Ref Range Status  06/17/2023 136 135 - 146 mmol/L Final         Passed - Cr in normal range and within 180 days    Creat  Date Value Ref Range Status  06/17/2023 0.70 0.60 - 1.00 mg/dL Final   Creatinine, Urine  Date Value Ref Range Status  07/10/2023 73 20 - 275 mg/dL Final         Passed - eGFR is 10 or above and within 180 days    eGFR  Date Value Ref Range Status  06/17/2023 91 > OR = 60 mL/min/1.80m2 Final         Passed - Patient is not pregnant      Passed - Last BP in normal range    BP Readings from Last 1 Encounters:  06/23/23 132/80         Passed - Valid encounter within last 6 months    Recent Outpatient Visits           4 months ago Essential hypertension   [place] [facility] Smitty Cords, DO   5 months ago Essential hypertension   [place] [facility] Delles, Gentry Fitz A, RPH-CPP   6 months ago Hyperlipidemia associated with type 2 diabetes mellitus [facility]   [place] [facility] Delles, Jackelyn Poling, RPH-CPP   7 months ago Annual physical exam   [place] [facility] Smitty Cords, DO   10 months ago Essential hypertension   [place] [facility] Delles, Jackelyn Poling, RPH-CPP       Future Appointments             In 5 months Althea Charon, Netta Neat, DO [place] [facility], [facility]

## 2023-11-27 ENCOUNTER — Ambulatory Visit (INDEPENDENT_AMBULATORY_CARE_PROVIDER_SITE_OTHER): Payer: Medicare Other

## 2023-11-27 DIAGNOSIS — Z0000 Encounter for general adult medical examination without abnormal findings: Secondary | ICD-10-CM

## 2023-11-27 NOTE — Progress Notes (Signed)
 Subjective:   Emily Hunt is a 73 y.o. who presents for a Medicare Wellness preventive visit.  Visit Complete: Virtual I connected with  [name] on 11/27/23 by a audio enabled telemedicine application and verified that I am speaking with the correct person using two identifiers.  Patient Location: Home  Provider Location: Office/Clinic  I discussed the limitations of evaluation and management by telemedicine. The patient expressed understanding and agreed to proceed.  Vital Signs: Because this visit was a virtual/telehealth visit, some criteria may be missing or patient reported. Any vitals not documented were not able to be obtained and vitals that have been documented are patient reported.  VideoDeclined- This patient declined Librarian, academic. Therefore the visit was completed with audio only.  AWV Questionnaire: No: Patient Medicare AWV questionnaire was not completed prior to this visit.  Cardiac Risk Factors include: advanced age (>34men, >105 women);diabetes mellitus;hypertension;dyslipidemia;obesity (BMI >30kg/m2)     Objective:    There were no vitals filed for this visit. There is no height or weight on file to calculate BMI.     11/27/2023   11:00 AM 11/21/2022   11:37 AM 03/01/2019    8:46 AM 01/23/2018    8:37 AM  Advanced Directives  Does Patient Have a Medical Advance Directive? Yes Yes No Yes  Type of Estate agent of [place];Living will Healthcare Power of [place];Living will  Healthcare Power of Attorney  Does patient want to make changes to medical advance directive? No - Patient declined No - Patient declined    Copy of Healthcare Power of Attorney in Chart? Yes - validated most recent copy scanned in chart (See row information) Yes - validated most recent copy scanned in chart (See row information)      Current Medications (verified) Outpatient Encounter Medications as of 11/27/2023  Medication Sig    b complex vitamins capsule Take 1 capsule by mouth daily.   CALCIUM MAGNESIUM ZINC PO Take by mouth.   dorzolamide-timolol (COSOPT) 2-0.5 % ophthalmic solution 1 drop 2 (two) times daily.   latanoprost (XALATAN) 0.005 % ophthalmic solution Place 1 drop into both eyes at bedtime.   Multiple Vitamin (MULTIVITAMIN) tablet Take 1 tablet by mouth daily.   rosuvastatin (CRESTOR) 5 MG tablet Take 1 tablet (5 mg total) by mouth every other day. Bedtime.   valsartan-hydrochlorothiazide (DIOVAN-HCT) 320-25 MG tablet Take 1 tablet by mouth daily.   fexofenadine (ALLEGRA) 180 MG tablet Take 180 mg by mouth daily. (Patient not taking: Reported on 11/27/2023)   No facility-administered encounter medications on file as of 11/27/2023.    Allergies (verified) Sulfa antibiotics, Neomycin, Strawberry extract, and Tomato   History: Past Medical History:  Diagnosis Date   Glaucoma    History reviewed. No pertinent surgical history. History reviewed. No pertinent family history. Social History   Socioeconomic History   Marital status: Widowed    Spouse name: Not on file   Number of children: Not on file   Years of education: Not on file   Highest education level: Some college, no degree  Occupational History   Not on file  Tobacco Use   Smoking status: Never   Smokeless tobacco: Never  Vaping Use   Vaping status: Never Used  Substance and Sexual Activity   Alcohol use: Never   Drug use: Never   Sexual activity: Not on file  Other Topics Concern   Not on file  Social History Narrative   Not on file  Social Drivers of Corporate investment banker Strain: Low Risk  (11/27/2023)   Overall Financial Resource Strain (CARDIA)    Difficulty of Paying Living Expenses: Not hard at all  Recent Concern: Financial Resource Strain - Medium Risk (11/23/2023)   Overall Financial Resource Strain (CARDIA)    Difficulty of Paying Living Expenses: Somewhat hard  Food Insecurity: No Food Insecurity (11/27/2023)    Hunger Vital Sign    Worried About Running Out of Food in the Last Year: Never true    Ran Out of Food in the Last Year: Never true  Recent Concern: Food Insecurity - Food Insecurity Present (11/23/2023)   Hunger Vital Sign    Worried About Running Out of Food in the Last Year: Sometimes true    Ran Out of Food in the Last Year: Sometimes true  Transportation Needs: No Transportation Needs (11/27/2023)   PRAPARE - Administrator, Civil Service (Medical): No    Lack of Transportation (Non-Medical): No  Physical Activity: Insufficiently Active (11/27/2023)   Exercise Vital Sign    Days of Exercise per Week: 2 days    Minutes of Exercise per Session: 30 min  Stress: No Stress Concern Present (11/27/2023)   [employer] of Occupational Health - Occupational Stress Questionnaire    Feeling of Stress : Not at all  Social Connections: Moderately Isolated (11/27/2023)   Social Connection and Isolation Panel [NHANES]    Frequency of Communication with Friends and Family: More than three times a week    Frequency of Social Gatherings with Friends and Family: More than three times a week    Attends Religious Services: More than 4 times per year    Active Member of [employer] or Organizations: No    Attends Banker Meetings: Never    Marital Status: Widowed    Tobacco Counseling Counseling given: Not Answered    Clinical Intake:  Pre-visit preparation completed: Yes  Pain : No/denies pain     BMI - recorded: 32.4 Nutritional Status: BMI > 30  Obese Nutritional Risks: None Diabetes: Yes CBG done?: No Did pt. bring in CBG monitor from home?: No  How often do you need to have someone help you when you read instructions, pamphlets, or other written materials from your doctor or pharmacy?: 1 - Never  Interpreter Needed?: No  Information entered by :: Kennedy Bucker, LPN   Activities of Daily Living     11/27/2023   11:02 AM 11/23/2023    3:28 PM  In your present  state of health, do you have any difficulty performing the following activities:  Hearing? 0 0  Vision? 0 0  Difficulty concentrating or making decisions? 0 0  Walking or climbing stairs? 0 0  Dressing or bathing? 0 0  Doing errands, shopping? 0 0  Preparing Food and eating ? N N  Using the Toilet? N N  In the past six months, have you accidently leaked urine? N N  Do you have problems with loss of bowel control? N N  Managing your Medications? N N  Managing your Finances? N N  Housekeeping or managing your Housekeeping? N N    Patient Care Team: Smitty Cords, DO as PCP - General (Family Medicine) Lockie Mola, MD as Referring Physician (Ophthalmology)  Indicate any recent Medical Services you may have received from other than [facility] providers in the past year (date may be approximate).     Assessment:   This is a routine wellness  examination for Trish.  Hearing/Vision screen Hearing Screening - Comments:: NO AIDS Vision Screening - Comments:: WEARS GLASSES ALL THE TIME- DR.BRASINGTON   Goals Addressed             This Visit's Progress    DIET - INCREASE WATER INTAKE         Depression Screen     11/27/2023   10:58 AM 06/23/2023    8:20 AM 03/19/2023    9:29 AM 11/21/2022   11:34 AM 08/12/2022   10:14 AM  PHQ 2/9 Scores  PHQ - 2 Score 0 0 0 1 0  PHQ- 9 Score 0 0 0 1 0    Fall Risk     11/27/2023   11:01 AM 11/23/2023    3:28 PM 06/23/2023    8:20 AM 03/19/2023    9:30 AM 11/21/2022   11:38 AM  Fall Risk   Falls in the past year? 1 1 1 1  0  Number falls in past yr: 0  0 0 0  Injury with Fall? 0  0 0 0  Risk for fall due to : History of fall(s)   No Fall Risks No Fall Risks  Follow up Falls evaluation completed;Falls prevention discussed   Falls evaluation completed Falls prevention discussed;Falls evaluation completed    MEDICARE RISK AT HOME:  Medicare Risk at Home Any stairs in or around the home?: Yes If so, are there any without  handrails?: No Home free of loose throw rugs in walkways, pet beds, electrical cords, etc?: Yes Adequate lighting in your home to reduce risk of falls?: Yes Life alert?: No Use of a cane, walker or w/c?: No Grab bars in the bathroom?: No Shower chair or bench in shower?: No Elevated toilet seat or a handicapped toilet?: No  TIMED UP AND GO:  Was the test performed?  No  Cognitive Function: 6CIT completed        11/27/2023   11:02 AM 11/21/2022   11:42 AM  6CIT Screen  What Year? 0 points 0 points  What month? 0 points 0 points  What time? 3 points 0 points  Count back from 20 0 points 0 points  Months in reverse 0 points 0 points  Repeat phrase 2 points 0 points  Total Score 5 points 0 points    Immunizations Immunization History  Administered Date(s) Administered   Influenza, High Dose Seasonal PF 07/24/2017   Influenza-Unspecified 07/08/2022, 06/07/2023   Moderna Sars-Covid-2 Vaccination 07/04/2022   PNEUMOCOCCAL CONJUGATE-20 07/08/2022   Pneumococcal Polysaccharide-23 07/17/2014   Respiratory Syncytial Virus Vaccine,Recomb Aduvanted(Arexvy) 07/04/2022    Screening Tests Health Maintenance  Topic Date Due   Hepatitis C Screening  Never done   DTaP/Tdap/Td (1 - Tdap) Never done   MAMMOGRAM  Never done   Zoster Vaccines- Shingrix (1 of 2) Never done   DEXA SCAN  Never done   COVID-19 Vaccine (2 - 2024-25 season) 05/24/2023   FOOT EXAM  09/13/2023   HEMOGLOBIN A1C  12/15/2023   OPHTHALMOLOGY EXAM  01/13/2024   Diabetic kidney evaluation - eGFR measurement  06/16/2024   Diabetic kidney evaluation - Urine ACR  07/09/2024   Medicare Annual Wellness (AWV)  11/26/2024   Fecal DNA (Cologuard)  09/26/2025   Pneumonia Vaccine 11+ Years old  Completed   INFLUENZA VACCINE  Completed   HPV VACCINES  Aged Out    Health Maintenance  Health Maintenance Due  Topic Date Due   Hepatitis C Screening  Never done  DTaP/Tdap/Td (1 - Tdap) Never done   MAMMOGRAM  Never done    Zoster Vaccines- Shingrix (1 of 2) Never done   DEXA SCAN  Never done   COVID-19 Vaccine (2 - 2024-25 season) 05/24/2023   FOOT EXAM  09/13/2023   Health Maintenance Items Addressed: DECLINED REFERRAL FOR MAMMOGRAM AND BONE DENSITY SCAN   Additional Screening:  Vision Screening: Recommended annual ophthalmology exams for early detection of glaucoma and other disorders of the eye.  Dental Screening: Recommended annual dental exams for proper oral hygiene  Community Resource Referral / Chronic Care Management: CRR required this visit?  No   CCM required this visit?  No     Plan:     I have personally reviewed and noted the following in the patient's chart:   Medical and social history Use of alcohol, tobacco or illicit drugs  Current medications and supplements including opioid prescriptions. Patient is not currently taking opioid prescriptions. Functional ability and status Nutritional status Physical activity Advanced directives List of other physicians Hospitalizations, surgeries, and ER visits in previous 12 months Vitals Screenings to include cognitive, depression, and falls Referrals and appointments  In addition, I have reviewed and discussed with patient certain preventive protocols, quality metrics, and best practice recommendations. A written personalized care plan for preventive services as well as general preventive health recommendations were provided to patient.     Hal Hope, LPN   02/28/6294   After Visit Summary: (MyChart) Due to this being a telephonic visit, the after visit summary with patients personalized plan was offered to patient via MyChart   Notes: Nothing significant to report at this time.

## 2023-11-27 NOTE — Patient Instructions (Addendum)
 [name] , Thank you for taking time to come for your Medicare Wellness Visit. I appreciate your ongoing commitment to your health goals. Please review the following plan we discussed and let me know if I can assist you in the future.   Referrals/Orders/Follow-Ups/Clinician Recommendations: NONE  This is a list of the screening recommended for you and due dates:  Health Maintenance  Topic Date Due   Hepatitis C Screening  Never done   DTaP/Tdap/Td vaccine (1 - Tdap) Never done   Mammogram  Never done   Zoster (Shingles) Vaccine (1 of 2) Never done   DEXA scan (bone density measurement)  Never done   COVID-19 Vaccine (2 - 2024-25 season) 05/24/2023   Complete foot exam   09/13/2023   Hemoglobin A1C  12/15/2023   Eye exam for diabetics  01/13/2024   Yearly kidney function blood test for diabetes  06/16/2024   Yearly kidney health urinalysis for diabetes  07/09/2024   Medicare Annual Wellness Visit  11/26/2024   Cologuard (Stool DNA test)  09/26/2025   Pneumonia Vaccine  Completed   Flu Shot  Completed   HPV Vaccine  Aged Out    Advanced directives: (In Chart) A copy of your advanced directives are scanned into your chart should your provider ever need it.  Next Medicare Annual Wellness Visit scheduled for next year: Yes   12/02/24 @ 10:10 AM BY PHONE

## 2024-01-05 DIAGNOSIS — H401132 Primary open-angle glaucoma, bilateral, moderate stage: Secondary | ICD-10-CM | POA: Diagnosis not present

## 2024-01-12 DIAGNOSIS — H401112 Primary open-angle glaucoma, right eye, moderate stage: Secondary | ICD-10-CM | POA: Diagnosis not present

## 2024-01-12 DIAGNOSIS — E119 Type 2 diabetes mellitus without complications: Secondary | ICD-10-CM | POA: Diagnosis not present

## 2024-01-12 DIAGNOSIS — H2513 Age-related nuclear cataract, bilateral: Secondary | ICD-10-CM | POA: Diagnosis not present

## 2024-01-12 DIAGNOSIS — H401123 Primary open-angle glaucoma, left eye, severe stage: Secondary | ICD-10-CM | POA: Diagnosis not present

## 2024-01-12 LAB — HM DIABETES EYE EXAM

## 2024-03-09 ENCOUNTER — Other Ambulatory Visit: Payer: Self-pay | Admitting: Family Medicine

## 2024-03-09 DIAGNOSIS — E1169 Type 2 diabetes mellitus with other specified complication: Secondary | ICD-10-CM

## 2024-03-10 ENCOUNTER — Other Ambulatory Visit: Payer: Self-pay | Admitting: Family Medicine

## 2024-03-10 DIAGNOSIS — E1169 Type 2 diabetes mellitus with other specified complication: Secondary | ICD-10-CM

## 2024-03-10 NOTE — Telephone Encounter (Unsigned)
 Copied from CRM [id number]. Topic: Clinical - Medication Refill >> Mar 10, 2024  8:55 AM Carlatta H wrote: Medication: rosuvastatin  (CRESTOR ) 5 MG tablet  Has the patient contacted their pharmacy? Yes (Agent: If no, request that the patient contact the pharmacy for the refill. If patient does not wish to contact the pharmacy document the reason why and proceed with request.) (Agent: If yes, when and what did the pharmacy advise?) Contact office  This is the patient's preferred pharmacy:  [id number] [place] - [address] AT [facility] [address] [place] [address] Phone: [phone] Fax: [phone]  Is this the correct pharmacy for this prescription? Yes If no, delete pharmacy and type the correct one.   Has the prescription been filled recently? No  Is the patient out of the medication? Yes  Has the patient been seen for an appointment in the last year OR does the patient have an upcoming appointment? Yes  Can we respond through MyChart? Yes  Agent: Please be advised that Rx refills may take up to 3 business days. We ask that you follow-up with your pharmacy.

## 2024-03-11 NOTE — Telephone Encounter (Signed)
 Lipid panel in date.    Rx not sent in.   Criteria met so rx sent in.  Requested Prescriptions  Pending Prescriptions Disp Refills   rosuvastatin  (CRESTOR ) 5 MG tablet [Pharmacy Med Name: ROSUVASTATIN  5MG  TABLETS] 45 tablet 3    Sig: TAKE 1 TABLET BY MOUTH EVERY OTHER DAY AT BEDTIME     Cardiovascular:  Antilipid - Statins 2 Failed - 03/11/2024  3:07 PM      Failed - Lipid Panel in normal range within the last 12 months    Cholesterol  Date Value Ref Range Status  06/17/2023 171 <200 mg/dL Final   LDL Cholesterol (Calc)  Date Value Ref Range Status  06/17/2023 87 mg/dL (calc) Final    Comment:    Reference range: <100 . Desirable range <100 mg/dL for primary prevention;   <70 mg/dL for patients with CHD or diabetic patients  with > or = 2 CHD risk factors. Aaron Aas LDL-C is now calculated using the Martin-Hopkins  calculation, which is a validated novel method providing  better accuracy than the Friedewald equation in the  estimation of LDL-C.  Melinda Sprawls et al. Erroll Heard. 6063;016(01): 2061-2068  (http://education.[facility].com/faq/FAQ164)    HDL  Date Value Ref Range Status  06/17/2023 66 > OR = 50 mg/dL Final   Triglycerides  Date Value Ref Range Status  06/17/2023 87 <150 mg/dL Final         Passed - Cr in normal range and within 360 days    Creat  Date Value Ref Range Status  06/17/2023 0.70 0.60 - 1.00 mg/dL Final   Creatinine, Urine  Date Value Ref Range Status  07/10/2023 73 20 - 275 mg/dL Final         Passed - Patient is not pregnant      Passed - Valid encounter within last 12 months    Recent Outpatient Visits   None     Future Appointments             In 2 weeks Romeo Co, Kayleen Party, DO [facility] [facility], [facility]

## 2024-03-11 NOTE — Telephone Encounter (Signed)
 Requested Prescriptions  Refused Prescriptions Disp Refills   rosuvastatin  (CRESTOR ) 5 MG tablet 45 tablet 3    Sig: Take 1 tablet (5 mg total) by mouth every other day. Bedtime.     Cardiovascular:  Antilipid - Statins 2 Failed - 03/11/2024  5:01 PM      Failed - Lipid Panel in normal range within the last 12 months    Cholesterol  Date Value Ref Range Status  06/17/2023 171 <200 mg/dL Final   LDL Cholesterol (Calc)  Date Value Ref Range Status  06/17/2023 87 mg/dL (calc) Final    Comment:    Reference range: <100 . Desirable range <100 mg/dL for primary prevention;   <70 mg/dL for patients with CHD or diabetic patients  with > or = 2 CHD risk factors. Aaron Aas LDL-C is now calculated using the Martin-Hopkins  calculation, which is a validated novel method providing  better accuracy than the Friedewald equation in the  estimation of LDL-C.  Melinda Sprawls et al. Erroll Heard. 1308;657(84): 2061-2068  (http://education.[facility].com/faq/FAQ164)    HDL  Date Value Ref Range Status  06/17/2023 66 > OR = 50 mg/dL Final   Triglycerides  Date Value Ref Range Status  06/17/2023 87 <150 mg/dL Final         Passed - Cr in normal range and within 360 days    Creat  Date Value Ref Range Status  06/17/2023 0.70 0.60 - 1.00 mg/dL Final   Creatinine, Urine  Date Value Ref Range Status  07/10/2023 73 20 - 275 mg/dL Final         Passed - Patient is not pregnant      Passed - Valid encounter within last 12 months    Recent Outpatient Visits   None     Future Appointments             In 2 weeks Romeo Co, Kayleen Party, DO [facility], [facility]

## 2024-03-22 ENCOUNTER — Other Ambulatory Visit: Payer: Self-pay

## 2024-03-23 ENCOUNTER — Other Ambulatory Visit: Payer: Self-pay | Admitting: Family Medicine

## 2024-03-23 ENCOUNTER — Other Ambulatory Visit

## 2024-03-23 DIAGNOSIS — I10 Essential (primary) hypertension: Secondary | ICD-10-CM | POA: Diagnosis not present

## 2024-03-23 DIAGNOSIS — E785 Hyperlipidemia, unspecified: Secondary | ICD-10-CM | POA: Diagnosis not present

## 2024-03-23 DIAGNOSIS — Z0000 Encounter for general adult medical examination without abnormal findings: Secondary | ICD-10-CM | POA: Diagnosis not present

## 2024-03-23 DIAGNOSIS — E1169 Type 2 diabetes mellitus with other specified complication: Secondary | ICD-10-CM | POA: Diagnosis not present

## 2024-03-24 LAB — COMPLETE METABOLIC PANEL WITHOUT GFR
AG Ratio: 2 (calc) (ref 1.0–2.5)
ALT: 17 U/L (ref 6–29)
AST: 19 U/L (ref 10–35)
Albumin: 4.4 g/dL (ref 3.6–5.1)
Alkaline phosphatase (APISO): 67 U/L (ref 37–153)
BUN: 16 mg/dL (ref 7–25)
CO2: 31 mmol/L (ref 20–32)
Calcium: 9.5 mg/dL (ref 8.6–10.4)
Chloride: 96 mmol/L — ABNORMAL LOW (ref 98–110)
Creat: 0.63 mg/dL (ref 0.60–1.00)
Globulin: 2.2 g/dL (ref 1.9–3.7)
Glucose, Bld: 119 mg/dL — ABNORMAL HIGH (ref 65–99)
Potassium: 3.9 mmol/L (ref 3.5–5.3)
Sodium: 136 mmol/L (ref 135–146)
Total Bilirubin: 0.4 mg/dL (ref 0.2–1.2)
Total Protein: 6.6 g/dL (ref 6.1–8.1)

## 2024-03-24 LAB — CBC WITH DIFFERENTIAL/PLATELET
Absolute Lymphocytes: 1802 {cells}/uL (ref 850–3900)
Absolute Monocytes: 441 {cells}/uL (ref 200–950)
Basophils Absolute: 32 {cells}/uL (ref 0–200)
Basophils Relative: 0.5 %
Eosinophils Absolute: 82 {cells}/uL (ref 15–500)
Eosinophils Relative: 1.3 %
HCT: 35.3 % (ref 35.0–45.0)
Hemoglobin: 11.3 g/dL — ABNORMAL LOW (ref 11.7–15.5)
MCH: 29 pg (ref 27.0–33.0)
MCHC: 32 g/dL (ref 32.0–36.0)
MCV: 90.7 fL (ref 80.0–100.0)
MPV: 9.4 fL (ref 7.5–12.5)
Monocytes Relative: 7 %
Neutro Abs: 3944 {cells}/uL (ref 1500–7800)
Neutrophils Relative %: 62.6 %
Platelets: 322 10*3/uL (ref 140–400)
RBC: 3.89 10*6/uL (ref 3.80–5.10)
RDW: 13.6 % (ref 11.0–15.0)
Total Lymphocyte: 28.6 %
WBC: 6.3 10*3/uL (ref 3.8–10.8)

## 2024-03-24 LAB — LIPID PANEL
Cholesterol: 173 mg/dL (ref <200)
HDL: 64 mg/dL (ref >=50)
LDL Cholesterol (Calc): 91 mg/dL
Non-HDL Cholesterol (Calc): 109 mg/dL (ref <130)
Total CHOL/HDL Ratio: 2.7 (calc) (ref <5.0)
Triglycerides: 87 mg/dL (ref <150)

## 2024-03-24 LAB — HEMOGLOBIN A1C
Hgb A1c MFr Bld: 6.5 % — ABNORMAL HIGH (ref <5.7)
Mean Plasma Glucose: 140 mg/dL
eAG (mmol/L): 7.7 mmol/L

## 2024-03-24 LAB — TSH: TSH: 3.01 m[IU]/L (ref 0.40–4.50)

## 2024-03-31 ENCOUNTER — Encounter: Payer: Self-pay | Admitting: Family Medicine

## 2024-03-31 ENCOUNTER — Ambulatory Visit (INDEPENDENT_AMBULATORY_CARE_PROVIDER_SITE_OTHER): Payer: Self-pay | Admitting: Family Medicine

## 2024-03-31 VITALS — BP 138/78 | HR 66 | Ht 60.0 in | Wt 168.0 lb

## 2024-03-31 DIAGNOSIS — E1169 Type 2 diabetes mellitus with other specified complication: Secondary | ICD-10-CM | POA: Diagnosis not present

## 2024-03-31 DIAGNOSIS — E785 Hyperlipidemia, unspecified: Secondary | ICD-10-CM

## 2024-03-31 DIAGNOSIS — I10 Essential (primary) hypertension: Secondary | ICD-10-CM

## 2024-03-31 MED ORDER — VALSARTAN-HYDROCHLOROTHIAZIDE 320-25 MG PO TABS: 1.0000 | ORAL_TABLET | Freq: Every day | ORAL | 3 refills | Status: AC

## 2024-03-31 NOTE — Patient Instructions (Addendum)
 Thank you for coming to the office today.  In future if need:  [facility] at [facility] Address: [address] [place], [place], [place] [address] Phone: [phone]  -----------------  Valsartan -hydrochlorothiazide  320-25mg  once daily refilled  Overall labs look great   FUTURE OPTION Coronary Calcium  Score Cardiac CT Scan. This is a screening test for patients aged 75-50+ with cardiovascular risk factors or who are healthy but would be interested in Cardiovascular Screening for heart disease. Even if there is a family history of heart disease, this imaging can be useful. Typically it can be done every 5+ years or at a different timeline we agree on  The scan will look at the chest and mainly focus on the heart and identify early signs of calcium  build up or blockages within the heart arteries. It is not 100% accurate for identifying blockages or heart disease, but it is useful to help us  predict who may have some early changes or be at risk in the future for a heart attack or cardiovascular problem.  The results are reviewed by a Cardiologist and they will document the results. It should become available on MyChart. Typically the results are divided into percentiles based on other patients of the same demographic and age. So it will compare your risk to others similar to you. If you have a higher score >99 or higher percentile >75%tile, it is recommended to consider Statin cholesterol therapy and or referral to Cardiologist. I will try to help explain your results and if we have questions we can contact the Cardiologist.  You will be contacted for scheduling. Usually it is done at any imaging facility through [facility], [facility] or [facility].  The cost is $99 flat fee total and it does not go through insurance, so no authorization is required.   Please schedule a Follow-up Appointment to: Return for 6 month DM  A1c.  If you have any other questions or concerns, please feel free to call the office or send a message through MyChart. You may also schedule an earlier appointment if necessary.  Additionally, you may be receiving a survey about your experience at our office within a few days to 1 week by e-mail or mail. We value your feedback.  Marsa Officer, DO [facility], [place]

## 2024-03-31 NOTE — Progress Notes (Signed)
 Subjective:    Patient ID: Emily Hunt, female    DOB: 1951-03-16, 73 y.o.   MRN: [id number]  Emily Hunt is a 73 y.o. female presenting on 03/31/2024 for Annual Exam   HPI  Discussed the use of AI scribe software for clinical note transcription with the patient, who gave verbal consent to proceed.  History of Present Illness   Emily Hunt is a 73 year old female who presents for an annual physical exam.   Hematologic status - Hemoglobin level 11.3, stable compared to one year ago - No iron supplementation - Uncertain if multivitamin contains iron - Diet includes green leafy vegetables and meats   Peripheral edema - No persistent swelling in feet or ankles - Mild swelling occurs with prolonged standing, such as during storms - Swelling managed by elevating feet  Functional status and transportation - Decreased driving frequency - Difficulty with transportation and managing travel independently - Considering relocation to [place] and exploring medical care options there     Mild Hypokalemia - resolved K 3.9 On Thiazide and ARB   CHRONIC HTN: Home readings controlled Off ACEI lisinopril  due to cough Current Meds - Valsartan -HCTZ 320-25mg  daily Lifestyle: - Diet: Goal to limit sodium in diet more - Exercise: Walking and gym treadmill regularly, summertime would water aerobics Admits occasional RLE mild ankle edema. Denies CP, dyspnea, HA, dizziness / lightheadedness    CHRONIC DM, Type 2: A1c 6.5, stable and unchanged. Not checking CBG Meds: None [facility] Dr Mittie  She is mostly diet controlled. Less walking now but plans to improve Denies hypoglycemia, polyuria, visual changes, numbness or tingling.   HYPERLIPIDEMIA: Continues Rosuvastatin  5mg  every other night Stable to improved lipid panel   The patient also reports a diet primarily consisting of salads, beans, and chicken, which they believe may be contributing to increased bowel  frequency and urgency. She describes their stools as soft but not loose. She expresses a desire to regulate their bowel movements and is considering the addition of dietary supplements, such as probiotics and fiber, to their regimen.    Health Maintenance:    Colon CA Screening: Completed Cologuard 09/26/22 next 2027   Breast CA Screening prior mammogram normal, she will do self checks and monitor       03/31/2024    8:05 AM 11/27/2023   10:58 AM 06/23/2023    8:20 AM  Depression screen PHQ 2/9  Decreased Interest 0 0 0  Down, Depressed, Hopeless 0 0 0  PHQ - 2 Score 0 0 0  Altered sleeping 0 0 0  Tired, decreased energy 0 0 0  Change in appetite 0 0 0  Feeling bad or failure about yourself  0 0 0  Trouble concentrating 0 0 0  Moving slowly or fidgety/restless 0 0 0  Suicidal thoughts 0 0 0  PHQ-9 Score 0 0 0  Difficult doing work/chores Not difficult at all Not difficult at all        03/31/2024    8:05 AM 06/23/2023    8:20 AM 03/19/2023    9:30 AM 08/12/2022   10:14 AM  GAD 7 : Generalized Anxiety Score  Nervous, Anxious, on Edge 0 0 0 0  Control/stop worrying 0 0 0 0  Worry too much - different things 0 0 0 0  Trouble relaxing 0 0 0 0  Restless 0 0 0 0  Easily annoyed or irritable 0 0 0 0  Afraid - awful might happen  0 0 0 0  Total GAD 7 Score 0 0 0 0  Anxiety Difficulty   Not difficult at all Not difficult at all     Past Medical History:  Diagnosis Date   Glaucoma    History reviewed. No pertinent surgical history. Social History   Socioeconomic History   Marital status: Widowed    Spouse name: Not on file   Number of children: Not on file   Years of education: Not on file   Highest education level: GED or equivalent  Occupational History   Not on file  Tobacco Use   Smoking status: Never   Smokeless tobacco: Never  Vaping Use   Vaping status: Never Used  Substance and Sexual Activity   Alcohol use: Never   Drug use: Never   Sexual activity: Not  on file  Other Topics Concern   Not on file  Social History Narrative   Not on file   Social Drivers of Health   Financial Resource Strain: Patient Declined (03/27/2024)   Overall Financial Resource Strain (CARDIA)    Difficulty of Paying Living Expenses: Patient declined  Food Insecurity: Patient Declined (03/27/2024)   Hunger Vital Sign    Worried About Running Out of Food in the Last Year: Patient declined    Ran Out of Food in the Last Year: Patient declined  Transportation Needs: No Transportation Needs (03/27/2024)   PRAPARE - Administrator, Civil Service (Medical): No    Lack of Transportation (Non-Medical): No  Physical Activity: Unknown (03/27/2024)   Exercise Vital Sign    Days of Exercise per Week: 1 day    Minutes of Exercise per Session: Not on file  Stress: No Stress Concern Present (03/27/2024)   [employer] of Occupational Health - Occupational Stress Questionnaire    Feeling of Stress: Not at all  Social Connections: Moderately Isolated (03/27/2024)   Social Connection and Isolation Panel    Frequency of Communication with Friends and Family: More than three times a week    Frequency of Social Gatherings with Friends and Family: More than three times a week    Attends Religious Services: More than 4 times per year    Active Member of [employer] or Organizations: No    Attends Banker Meetings: Not on file    Marital Status: Widowed  Intimate Partner Violence: Not At Risk (11/27/2023)   Humiliation, Afraid, Rape, and Kick questionnaire    Fear of Current or Ex-Partner: No    Emotionally Abused: No    Physically Abused: No    Sexually Abused: No   History reviewed. No pertinent family history. Current Outpatient Medications on File Prior to Visit  Medication Sig   b complex vitamins capsule Take 1 capsule by mouth daily.   CALCIUM  MAGNESIUM ZINC PO Take by mouth.   dorzolamide-timolol (COSOPT) 2-0.5 % ophthalmic solution 1 drop 2 (two) times  daily.   fexofenadine (ALLEGRA) 180 MG tablet Take 180 mg by mouth daily.   latanoprost (XALATAN) 0.005 % ophthalmic solution Place 1 drop into both eyes at bedtime.   Multiple Vitamin (MULTIVITAMIN) tablet Take 1 tablet by mouth daily.   rosuvastatin  (CRESTOR ) 5 MG tablet TAKE 1 TABLET BY MOUTH EVERY OTHER DAY AT BEDTIME   No current facility-administered medications on file prior to visit.    Review of Systems  Constitutional:  Negative for activity change, appetite change, chills, diaphoresis, fatigue and fever.  HENT:  Negative for congestion and hearing loss.  Eyes:  Negative for visual disturbance.  Respiratory:  Negative for cough, chest tightness, shortness of breath and wheezing.   Cardiovascular:  Negative for chest pain, palpitations and leg swelling.  Gastrointestinal:  Negative for abdominal pain, constipation, diarrhea, nausea and vomiting.  Genitourinary:  Negative for dysuria, frequency and hematuria.  Musculoskeletal:  Negative for arthralgias and neck pain.  Skin:  Negative for rash.  Neurological:  Negative for dizziness, weakness, light-headedness, numbness and headaches.  Hematological:  Negative for adenopathy.  Psychiatric/Behavioral:  Negative for behavioral problems, dysphoric mood and sleep disturbance.    Per HPI unless specifically indicated above     Objective:    BP 138/78   Pulse 66   Ht 5' (1.524 m)   Wt 168 lb (76.2 kg)   SpO2 99%   BMI 32.81 kg/m   Wt Readings from Last 3 Encounters:  03/31/24 168 lb (76.2 kg)  06/23/23 166 lb (75.3 kg)  03/19/23 169 lb 3.2 oz (76.7 kg)    Physical Exam Vitals and nursing note reviewed.  Constitutional:      General: She is not in acute distress.    Appearance: She is well-developed. She is not diaphoretic.     Comments: Well-appearing, comfortable, cooperative  HENT:     Head: Normocephalic and atraumatic.  Eyes:     General:        Right eye: No discharge.        Left eye: No discharge.      Conjunctiva/sclera: Conjunctivae normal.     Pupils: Pupils are equal, round, and reactive to light.  Neck:     Thyroid : No thyromegaly.  Cardiovascular:     Rate and Rhythm: Normal rate and regular rhythm.     Pulses: Normal pulses.     Heart sounds: Normal heart sounds. No murmur heard. Pulmonary:     Effort: Pulmonary effort is normal. No respiratory distress.     Breath sounds: Normal breath sounds. No wheezing or rales.  Abdominal:     General: Bowel sounds are normal. There is no distension.     Palpations: Abdomen is soft. There is no mass.     Tenderness: There is no abdominal tenderness.  Musculoskeletal:        General: No tenderness. Normal range of motion.     Cervical back: Normal range of motion and neck supple.     Right lower leg: No edema.     Left lower leg: No edema.     Comments: Upper / Lower Extremities: - Normal muscle tone, strength bilateral upper extremities 5/5, lower extremities 5/5  Lymphadenopathy:     Cervical: No cervical adenopathy.  Skin:    General: Skin is warm and dry.     Findings: No erythema or rash.  Neurological:     Mental Status: She is alert and oriented to person, place, and time.     Comments: Distal sensation intact to light touch all extremities  Psychiatric:        Mood and Affect: Mood normal.        Behavior: Behavior normal.        Thought Content: Thought content normal.     Comments: Well groomed, good eye contact, normal speech and thoughts     Diabetic Foot Exam - Simple   Simple Foot Form Diabetic Foot exam was performed with the following findings: Yes 03/31/2024  8:19 AM  Visual Inspection No deformities, no ulcerations, no other skin breakdown bilaterally: Yes Sensation Testing Intact  to touch and monofilament testing bilaterally: Yes Pulse Check Posterior Tibialis and Dorsalis pulse intact bilaterally: Yes Comments     Results for orders placed or performed in visit on 03/23/24  Lipid panel   Collection  Time: 03/23/24  8:11 AM  Result Value Ref Range   Cholesterol 173 <200 mg/dL   HDL 64 > OR = 50 mg/dL   Triglycerides 87 <849 mg/dL   LDL Cholesterol (Calc) 91 mg/dL (calc)   Total CHOL/HDL Ratio 2.7 <5.0 (calc)   Non-HDL Cholesterol (Calc) 109 <130 mg/dL (calc)  COMPLETE METABOLIC PANEL WITHOUT GFR   Collection Time: 03/23/24  8:11 AM  Result Value Ref Range   Glucose, Bld 119 (H) 65 - 99 mg/dL   BUN 16 7 - 25 mg/dL   Creat 9.36 9.39 - 8.99 mg/dL   BUN/Creatinine Ratio SEE NOTE: 6 - 22 (calc)   Sodium 136 135 - 146 mmol/L   Potassium 3.9 3.5 - 5.3 mmol/L   Chloride 96 (L) 98 - 110 mmol/L   CO2 31 20 - 32 mmol/L   Calcium  9.5 8.6 - 10.4 mg/dL   Total Protein 6.6 6.1 - 8.1 g/dL   Albumin 4.4 3.6 - 5.1 g/dL   Globulin 2.2 1.9 - 3.7 g/dL (calc)   AG Ratio 2.0 1.0 - 2.5 (calc)   Total Bilirubin 0.4 0.2 - 1.2 mg/dL   Alkaline phosphatase (APISO) 67 37 - 153 U/L   AST 19 10 - 35 U/L   ALT 17 6 - 29 U/L  Hemoglobin A1c   Collection Time: 03/23/24  8:11 AM  Result Value Ref Range   Hgb A1c MFr Bld 6.5 (H) <5.7 %   Mean Plasma Glucose 140 mg/dL   eAG (mmol/L) 7.7 mmol/L  TSH   Collection Time: 03/23/24  8:11 AM  Result Value Ref Range   TSH 3.01 0.40 - 4.50 mIU/L  CBC with Differential/Platelet   Collection Time: 03/23/24  8:11 AM  Result Value Ref Range   WBC 6.3 3.8 - 10.8 Thousand/uL   RBC 3.89 3.80 - 5.10 Million/uL   Hemoglobin 11.3 (L) 11.7 - 15.5 g/dL   HCT 64.6 64.9 - 54.9 %   MCV 90.7 80.0 - 100.0 fL   MCH 29.0 27.0 - 33.0 pg   MCHC 32.0 32.0 - 36.0 g/dL   RDW 86.3 88.9 - 84.9 %   Platelets 322 140 - 400 Thousand/uL   MPV 9.4 7.5 - 12.5 fL   Neutro Abs 3,944 1,500 - 7,800 cells/uL   Absolute Lymphocytes 1,802 850 - 3,900 cells/uL   Absolute Monocytes 441 200 - 950 cells/uL   Eosinophils Absolute 82 15 - 500 cells/uL   Basophils Absolute 32 0 - 200 cells/uL   Neutrophils Relative % 62.6 %   Total Lymphocyte 28.6 %   Monocytes Relative 7.0 %   Eosinophils  Relative 1.3 %   Basophils Relative 0.5 %      Assessment & Plan:   Problem List Items Addressed This Visit     Essential hypertension   Relevant Medications   valsartan -hydrochlorothiazide  (DIOVAN -HCT) 320-25 MG tablet   Hyperlipidemia associated with type 2 diabetes mellitus (HCC) - Primary   Relevant Medications   valsartan -hydrochlorothiazide  (DIOVAN -HCT) 320-25 MG tablet   Type 2 diabetes mellitus with other specified complication (HCC)   Relevant Medications   valsartan -hydrochlorothiazide  (DIOVAN -HCT) 320-25 MG tablet     Updated Health Maintenance information Reviewed recent lab results with patient Encouraged improvement to lifestyle with diet and exercise Goal of  weight loss   Annual Physical Examination Routine exam with no specific concerns. Mild anemia noted, not concerning. Cholesterol and diabetes well-controlled. Declined optional heart scan. - Continue current medications including rosuvastatin  every other night. - Refill antihypertensive medication. - Schedule follow-up in six months.  Diabetes Mellitus Type 2 Type 2 diabetes well-controlled with diet. A1c at 6.5. - Continue current dietary management.  Hypertension Hypertension well-controlled at 138/78 with current medication. - Refill antihypertensive medication at [facility] in [place].  Anemia Mild anemia with hemoglobin at 11.3, no significant symptoms. Advised against iron supplements due to potential side effects. - Encourage consumption of iron-rich foods such as green leafy vegetables and meats.  General Health Maintenance Up to date with most vaccinations and screenings. Shingles vaccination pending. - Receive shingles vaccination in late August or September. - Continue routine health maintenance and screenings as scheduled.        No orders of the defined types were placed in this encounter.   Meds ordered this encounter  Medications   valsartan -hydrochlorothiazide  (DIOVAN -HCT)  320-25 MG tablet    Sig: Take 1 tablet by mouth daily.    Dispense:  90 tablet    Refill:  3     Follow up plan: Return for 6 month DM A1c.  Marsa Officer, DO [facility] 03/31/2024, 8:17 AM

## 2024-04-20 ENCOUNTER — Other Ambulatory Visit: Payer: Self-pay | Admitting: Family Medicine

## 2024-04-20 DIAGNOSIS — I10 Essential (primary) hypertension: Secondary | ICD-10-CM

## 2024-04-21 ENCOUNTER — Telehealth: Payer: Self-pay

## 2024-04-21 NOTE — Telephone Encounter (Signed)
 Requested Prescriptions  Refused Prescriptions Disp Refills   valsartan -hydrochlorothiazide  (DIOVAN -HCT) 320-25 MG tablet [Pharmacy Med Name: VALSARTAN /HCTZ 320MG /25MG  TABLETS] 90 tablet 3    Sig: TAKE 1 TABLET BY MOUTH DAILY     Cardiovascular: ARB + Diuretic Combos Failed - 04/21/2024  9:49 AM      Failed - eGFR is 10 or above and within 180 days    eGFR  Date Value Ref Range Status  06/17/2023 91 > OR = 60 mL/min/1.1m2 Final         Passed - K in normal range and within 180 days    Potassium  Date Value Ref Range Status  03/23/2024 3.9 3.5 - 5.3 mmol/L Final         Passed - Na in normal range and within 180 days    Sodium  Date Value Ref Range Status  03/23/2024 136 135 - 146 mmol/L Final         Passed - Cr in normal range and within 180 days    Creat  Date Value Ref Range Status  03/23/2024 0.63 0.60 - 1.00 mg/dL Final   Creatinine, Urine  Date Value Ref Range Status  07/10/2023 73 20 - 275 mg/dL Final         Passed - Patient is not pregnant      Passed - Last BP in normal range    BP Readings from Last 1 Encounters:  03/31/24 138/78         Passed - Valid encounter within last 6 months    Recent Outpatient Visits           3 weeks ago Hyperlipidemia associated with type 2 diabetes mellitus [facility])   [facility] [place], Marsa PARAS, DO

## 2024-04-21 NOTE — Telephone Encounter (Signed)
 Copied from CRM [id number]. Topic: Clinical - Medication Question >> Apr 21, 2024 10:04 AM Myrick T wrote: Reason for CRM: patient called said [facility] pharmacy said they were holding her valsartan -hydrochlorothiazide  (DIOVAN -HCT) 320-25 MG tablet that was sent for refill on 7/10 because she was not due for a refill at the time so when she went to get it today they only gave her 5 pills and said her provider denied the refill. I advised patient she was prescribed a 90 day supply on 7/10 with 3 refills. Patient is asking that someone call the pharmacy as they are giving her a hard time.

## 2024-04-25 ENCOUNTER — Ambulatory Visit: Payer: Self-pay

## 2024-04-25 NOTE — Telephone Encounter (Signed)
 FYI Only or Action Required?: FYI only for provider.  Patient was last seen in primary care on 03/31/2024 by Edman Marsa PARAS, DO.  Called Nurse Triage reporting Blurred Vision.  Symptoms began several weeks ago.  Interventions attempted: Other: patient has been applying eye drops.  Symptoms are: unchanged.  Triage Disposition: See PCP When Office is Open (Within 3 Days)  Patient/caregiver understands and will follow disposition?: Yes        Copied from CRM [id number]. Topic: Clinical - Medication Question >> Apr 25, 2024  3:42 PM Precious C wrote: Reason for CRM: Patient called in to ask if the medication she is taking, Losartan Hydrochlorothiazide , could be causing blurred vision. She requested a follow-up by phone. Callback number: [phone].        Reason for Disposition  [1] Brief (now gone) blurred vision AND [2] unexplained  Answer Assessment - Initial Assessment Questions 1. DESCRIPTION: How has your vision changed? (e.g., complete vision loss, blurred vision, double vision, floaters, etc.)     Blurred vision  2. LOCATION: One or both eyes? If one, ask: Which eye?     Both eyes  3. SEVERITY: Can you see anything? If Yes, ask: What can you see? (e.g., fine print)     Mild, was unsure if it was due to her glasses  4. ONSET: When did this begin? Did it start suddenly or has this been gradual?     A couple of weeks or longer  5. PATTERN: Does this come and go, or has it been constant since it started?     Intermittent  6. PAIN: Is there any pain in your eye(s)?  (Scale 1-10; or mild, moderate, severe)     No 7. CONTACTS-GLASSES: Do you wear contacts or glasses?     Yes  8. CAUSE: What do you think is causing this visual problem?     Unsure, possibly due to medication  9. OTHER SYMPTOMS: Do you have any other symptoms? (e.g., confusion, headache, arm or leg weakness, speech problems)     No  Protocols used: Vision Loss or  Change-A-AH

## 2024-04-26 ENCOUNTER — Ambulatory Visit (INDEPENDENT_AMBULATORY_CARE_PROVIDER_SITE_OTHER): Admitting: Family Medicine

## 2024-04-26 ENCOUNTER — Encounter: Payer: Self-pay | Admitting: Family Medicine

## 2024-04-26 VITALS — BP 150/74 | HR 65 | Ht 60.0 in | Wt 170.1 lb

## 2024-04-26 DIAGNOSIS — H538 Other visual disturbances: Secondary | ICD-10-CM | POA: Diagnosis not present

## 2024-04-26 DIAGNOSIS — H269 Unspecified cataract: Secondary | ICD-10-CM

## 2024-04-26 DIAGNOSIS — I10 Essential (primary) hypertension: Secondary | ICD-10-CM

## 2024-04-26 NOTE — Progress Notes (Signed)
 Subjective:    Patient ID: Emily Hunt, female    DOB: 1951-06-17, 73 y.o.   MRN: [id number]  Emily Hunt is a 73 y.o. female presenting on 04/26/2024 for Blurred Vision (Couple weeks of blurred vision)  Patient presents for a same day appointment.  HPI  Discussed the use of AI scribe software for clinical note transcription with the patient, who gave verbal consent to proceed.  History of Present Illness   Emily Hunt is a 73 year old female with a history of cataracts who presents with blurred vision.  Blurred vision Bilateral - Blurred vision in both eyes for the past few weeks - Worsens during activities such as driving, using the computer, or watching TV - No driving at night, so nighttime vision issues cannot be assessed - No eye pain, watering, swelling, loss of vision, or inability to move the eyes in different directions - History of cataracts evaluated but not removed - Followed by [facility], last visit 06/2023, documented cataract - Initially attributed symptoms to scuffed glasses with possibly deteriorating protective coating  Hypertension - Recent elevated blood pressure readings - Attributes elevated blood pressure to increased salt intake over the past two days - Monitors blood pressure at home - Currently taking antihypertensive medication valsartan -hctz; new prescription has a different appearance from previous supply - Read that blurred vision could be a side effect of her blood pressure medication  She has difficulty focusing vision at times, has to adjust glasses  Difficulty to see in bright light outside or difficulty at night      04/26/2024    8:34 AM 03/31/2024    8:05 AM 11/27/2023   10:58 AM  Depression screen PHQ 2/9  Decreased Interest 0 0 0  Down, Depressed, Hopeless 0 0 0  PHQ - 2 Score 0 0 0  Altered sleeping 0 0 0  Tired, decreased energy 0 0 0  Change in appetite 0 0 0  Feeling bad or failure about yourself  0 0 0   Trouble concentrating 0 0 0  Moving slowly or fidgety/restless 0 0 0  Suicidal thoughts 0 0 0  PHQ-9 Score 0 0 0  Difficult doing work/chores  Not difficult at all Not difficult at all       04/26/2024    8:34 AM 03/31/2024    8:05 AM 06/23/2023    8:20 AM 03/19/2023    9:30 AM  GAD 7 : Generalized Anxiety Score  Nervous, Anxious, on Edge 0 0 0 0  Control/stop worrying 0 0 0 0  Worry too much - different things 0 0 0 0  Trouble relaxing 0 0 0 0  Restless 0 0 0 0  Easily annoyed or irritable 0 0 0 0  Afraid - awful might happen 0 0 0 0  Total GAD 7 Score 0 0 0 0  Anxiety Difficulty    Not difficult at all    Social History   Tobacco Use   Smoking status: Never   Smokeless tobacco: Never  Vaping Use   Vaping status: Never Used  Substance Use Topics   Alcohol use: Never   Drug use: Never    Review of Systems Per HPI unless specifically indicated above     Objective:    BP (!) 150/74 (BP Location: Left Arm, Cuff Size: Normal)   Pulse 65   Ht 5' (1.524 m)   Wt 170 lb 2 oz (77.2 kg)   SpO2 100%  BMI 33.23 kg/m   Wt Readings from Last 3 Encounters:  04/26/24 170 lb 2 oz (77.2 kg)  03/31/24 168 lb (76.2 kg)  06/23/23 166 lb (75.3 kg)    Physical Exam Vitals and nursing note reviewed.  Constitutional:      General: She is not in acute distress.    Appearance: Normal appearance. She is well-developed. She is not diaphoretic.     Comments: Well-appearing, comfortable, cooperative  HENT:     Head: Normocephalic and atraumatic.  Eyes:     General:        Right eye: No discharge.        Left eye: No discharge.     Extraocular Movements: Extraocular movements intact.     Conjunctiva/sclera: Conjunctivae normal.     Pupils: Pupils are equal, round, and reactive to light.     Comments: Using ophthalmoscope, appearance of cataracts are visible. No other obvious abnormality identified  Cardiovascular:     Rate and Rhythm: Normal rate.  Pulmonary:     Effort:  Pulmonary effort is normal.  Skin:    General: Skin is warm and dry.     Findings: No erythema or rash.  Neurological:     Mental Status: She is alert and oriented to person, place, and time.  Psychiatric:        Mood and Affect: Mood normal.        Behavior: Behavior normal.        Thought Content: Thought content normal.     Comments: Well groomed, good eye contact, normal speech and thoughts     Results for orders placed or performed in visit on 03/23/24  Lipid panel   Collection Time: 03/23/24  8:11 AM  Result Value Ref Range   Cholesterol 173 <200 mg/dL   HDL 64 > OR = 50 mg/dL   Triglycerides 87 <849 mg/dL   LDL Cholesterol (Calc) 91 mg/dL (calc)   Total CHOL/HDL Ratio 2.7 <5.0 (calc)   Non-HDL Cholesterol (Calc) 109 <130 mg/dL (calc)  COMPLETE METABOLIC PANEL WITHOUT GFR   Collection Time: 03/23/24  8:11 AM  Result Value Ref Range   Glucose, Bld 119 (H) 65 - 99 mg/dL   BUN 16 7 - 25 mg/dL   Creat 9.36 9.39 - 8.99 mg/dL   BUN/Creatinine Ratio SEE NOTE: 6 - 22 (calc)   Sodium 136 135 - 146 mmol/L   Potassium 3.9 3.5 - 5.3 mmol/L   Chloride 96 (L) 98 - 110 mmol/L   CO2 31 20 - 32 mmol/L   Calcium  9.5 8.6 - 10.4 mg/dL   Total Protein 6.6 6.1 - 8.1 g/dL   Albumin 4.4 3.6 - 5.1 g/dL   Globulin 2.2 1.9 - 3.7 g/dL (calc)   AG Ratio 2.0 1.0 - 2.5 (calc)   Total Bilirubin 0.4 0.2 - 1.2 mg/dL   Alkaline phosphatase (APISO) 67 37 - 153 U/L   AST 19 10 - 35 U/L   ALT 17 6 - 29 U/L  Hemoglobin A1c   Collection Time: 03/23/24  8:11 AM  Result Value Ref Range   Hgb A1c MFr Bld 6.5 (H) <5.7 %   Mean Plasma Glucose 140 mg/dL   eAG (mmol/L) 7.7 mmol/L  TSH   Collection Time: 03/23/24  8:11 AM  Result Value Ref Range   TSH 3.01 0.40 - 4.50 mIU/L  CBC with Differential/Platelet   Collection Time: 03/23/24  8:11 AM  Result Value Ref Range   WBC 6.3 3.8 - 10.8  Thousand/uL   RBC 3.89 3.80 - 5.10 Million/uL   Hemoglobin 11.3 (L) 11.7 - 15.5 g/dL   HCT 64.6 64.9 - 54.9 %    MCV 90.7 80.0 - 100.0 fL   MCH 29.0 27.0 - 33.0 pg   MCHC 32.0 32.0 - 36.0 g/dL   RDW 86.3 88.9 - 84.9 %   Platelets 322 140 - 400 Thousand/uL   MPV 9.4 7.5 - 12.5 fL   Neutro Abs 3,944 1,500 - 7,800 cells/uL   Absolute Lymphocytes 1,802 850 - 3,900 cells/uL   Absolute Monocytes 441 200 - 950 cells/uL   Eosinophils Absolute 82 15 - 500 cells/uL   Basophils Absolute 32 0 - 200 cells/uL   Neutrophils Relative % 62.6 %   Total Lymphocyte 28.6 %   Monocytes Relative 7.0 %   Eosinophils Relative 1.3 %   Basophils Relative 0.5 %      Assessment & Plan:   Problem List Items Addressed This Visit     Essential hypertension   Other Visit Diagnoses       Blurry vision, bilateral    -  Primary     Cataract of both eyes, unspecified cataract type            Blurred vision possibly related to cataract and eyeglass lens changes Followed by [facility] Dr Mittie Blurred vision likely due to cataract progression or lens coating deterioration. No acute symptoms. External eye exam normal except cataract haziness. Unlikely related to blood pressure medication, she was concerned about possible side effect on Valsartan -hydrochlorothiazide  however I cannot identify blurry vision as direct linked side effect on this med. - Check with [facility], Dr. Mittie, on August 18th for further evaluation, already scheduled apt. - She notes new manufacturer on her BP med. Different size shape and color. Asked her to confirm with pharmacist that blood pressure medication is correct and inquire about blurred vision as a side effect.  Essential hypertension Blood pressure slightly elevated, possibly due to increased sodium intake. Repeat still elevated but improving. - Monitor blood pressure at home. - No change to current therapy today - Limit sodium intake and increase water consumption.        No orders of the defined types were placed in this encounter.   No orders of the defined  types were placed in this encounter.   Follow up plan: Return if symptoms worsen or fail to improve.   Marsa Officer, DO [facility] 04/26/2024, 8:59 AM

## 2024-04-26 NOTE — Patient Instructions (Addendum)
 Thank you for coming to the office today.  Please check with [facility] Dr Mittie at your next apt  Blurry vision may be from cataract, or the glasses lens / looks like reflective layer is flaking off  Check with the pharmacist and bring your BP medication to confirm it is correct, can ask about blurry vision.  Please schedule a Follow-up Appointment to: Return if symptoms worsen or fail to improve.  If you have any other questions or concerns, please feel free to call the office or send a message through MyChart. You may also schedule an earlier appointment if necessary.  Additionally, you may be receiving a survey about your experience at our office within a few days to 1 week by e-mail or mail. We value your feedback.  Marsa Officer, DO [facility], [place]

## 2024-05-09 DIAGNOSIS — H401123 Primary open-angle glaucoma, left eye, severe stage: Secondary | ICD-10-CM | POA: Diagnosis not present

## 2024-05-09 DIAGNOSIS — H2513 Age-related nuclear cataract, bilateral: Secondary | ICD-10-CM | POA: Diagnosis not present

## 2024-05-09 DIAGNOSIS — H401112 Primary open-angle glaucoma, right eye, moderate stage: Secondary | ICD-10-CM | POA: Diagnosis not present

## 2024-06-03 ENCOUNTER — Telehealth: Payer: Self-pay

## 2024-06-03 MED ORDER — COVID-19 MRNA VAC-TRIS(PFIZER) 30 MCG/0.3ML IM SUSY: 0.3000 mL | PREFILLED_SYRINGE | Freq: Once | INTRAMUSCULAR | 0 refills | Status: AC

## 2024-06-03 NOTE — Telephone Encounter (Signed)
 Copied from CRM [id number]. Topic: Clinical - Medication Question >> Jun 03, 2024 10:28 AM Debby BROCKS wrote: Reason for CRM: Patient would like to receive the new covid prescription so that she may get the vaccine at her clinic

## 2024-06-03 NOTE — Telephone Encounter (Signed)
 Patient advised.

## 2024-06-03 NOTE — Addendum Note (Signed)
 Addended by: ANTONETTE ANGELINE ORN on: 06/03/2024 10:42 AM   Modules accepted: Orders

## 2024-06-03 NOTE — Telephone Encounter (Signed)
 RX for covid vaccine sent to pharmacy

## 2024-08-15 NOTE — Progress Notes (Signed)
 Emily Hunt                                          MRN: [id number]   08/15/2024   The [facility] Quality Team Specialist reviewed this patient medical record for the purposes of chart review for care gap closure. The following were reviewed: chart review for care gap closure-kidney health evaluation for diabetes:eGFR  and uACR.    [facility] Quality Team

## 2024-08-15 NOTE — Progress Notes (Signed)
 Vonceil R Frumkin                                          MRN: [id number]   08/15/2024   The [facility] Quality Team Specialist reviewed this patient medical record for the purposes of chart review for care gap closure. The following were reviewed: abstraction for care gap closure-glycemic status assessment.    [facility]I Quality Team

## 2024-09-06 NOTE — Progress Notes (Signed)
 Emily Hunt                                          MRN: [id number]   09/06/2024   The [facility] Quality Team Specialist reviewed this patient medical record for the purposes of chart review for care gap closure. The following were reviewed: chart review for care gap closure-kidney health evaluation for diabetes:eGFR  and uACR.    [facility]I Quality Team

## 2024-10-03 ENCOUNTER — Ambulatory Visit: Admitting: Family Medicine

## 2024-10-11 ENCOUNTER — Other Ambulatory Visit: Payer: Self-pay | Admitting: Family Medicine

## 2024-10-11 ENCOUNTER — Ambulatory Visit (INDEPENDENT_AMBULATORY_CARE_PROVIDER_SITE_OTHER): Admitting: Family Medicine

## 2024-10-11 ENCOUNTER — Encounter: Payer: Self-pay | Admitting: Family Medicine

## 2024-10-11 VITALS — BP 152/80 | HR 65 | Ht 60.0 in | Wt 172.4 lb

## 2024-10-11 DIAGNOSIS — E66811 Obesity, class 1: Secondary | ICD-10-CM

## 2024-10-11 DIAGNOSIS — E1169 Type 2 diabetes mellitus with other specified complication: Secondary | ICD-10-CM | POA: Diagnosis not present

## 2024-10-11 DIAGNOSIS — I10 Essential (primary) hypertension: Secondary | ICD-10-CM

## 2024-10-11 DIAGNOSIS — E785 Hyperlipidemia, unspecified: Secondary | ICD-10-CM | POA: Diagnosis not present

## 2024-10-11 DIAGNOSIS — Z0000 Encounter for general adult medical examination without abnormal findings: Secondary | ICD-10-CM

## 2024-10-11 LAB — POCT GLYCOSYLATED HEMOGLOBIN (HGB A1C): Hemoglobin A1C: 6.1 % — AB (ref 4.0–5.6)

## 2024-10-11 NOTE — Progress Notes (Signed)
 "  Subjective:    Patient ID: Emily Hunt, female    DOB: 04-06-1951, 74 y.o.   MRN: [id number]  Emily Hunt is a 74 y.o. female presenting on 10/11/2024 for Medical Management of Chronic Issues, Hypertension, and Diabetes   HPI      CHRONIC HTN: Today elevated BP rushing to apt and needs new tires Home readings controlled or improved, elevated in office. Off ACEI lisinopril  due to cough Current Meds - Valsartan -HCTZ 320-25mg  daily Lifestyle: - Diet: Trying to limit sodium in diet - Exercise: Walking and gym treadmill regularly, summertime would water aerobics Denies CP, dyspnea, HA, dizziness / lightheadedness    CHRONIC DM, Type 2: Obesity BMI >33 A1c 6.1, improvement from prior 6.5  Continues to improve diet and avoid high carb sugar Not checking CBG Meds: None [facility] Dr Mittie  Goals to improve exercise Denies hypoglycemia, polyuria, visual changes, numbness or tingling.   HYPERLIPIDEMIA: Continues Rosuvastatin  5mg  every other night Stable to improved lipid panel last checked       Health Maintenance:    Colon CA Screening: Completed Cologuard 09/26/22 next 2027   Breast CA Screening prior mammogram normal, she will do self checks and monitor      10/11/2024    1:31 PM 04/26/2024    8:34 AM 03/31/2024    8:05 AM  Depression screen PHQ 2/9  Decreased Interest 0 0 0  Down, Depressed, Hopeless 0 0 0  PHQ - 2 Score 0 0 0  Altered sleeping  0 0  Tired, decreased energy  0 0  Change in appetite  0 0  Feeling bad or failure about yourself   0 0  Trouble concentrating  0 0  Moving slowly or fidgety/restless  0 0  Suicidal thoughts  0 0  PHQ-9 Score  0  0   Difficult doing work/chores   Not difficult at all     Data saved with a previous flowsheet row definition       04/26/2024    8:34 AM 03/31/2024    8:05 AM 06/23/2023    8:20 AM 03/19/2023    9:30 AM  GAD 7 : Generalized Anxiety Score  Nervous, Anxious, on Edge 0  0  0  0    Control/stop worrying 0  0  0  0   Worry too much - different things 0  0  0  0   Trouble relaxing 0  0  0  0   Restless 0  0  0  0   Easily annoyed or irritable 0  0  0  0   Afraid - awful might happen 0  0  0  0   Total GAD 7 Score 0 0 0 0  Anxiety Difficulty    Not difficult at all     Data saved with a previous flowsheet row definition    Social History[1]  Review of Systems Per HPI unless specifically indicated above     Objective:    BP (!) 152/80 (BP Location: Left Arm, Cuff Size: Normal)   Pulse 65   Ht 5' (1.524 m)   Wt 172 lb 6 oz (78.2 kg)   SpO2 98%   BMI 33.66 kg/m   Wt Readings from Last 3 Encounters:  10/11/24 172 lb 6 oz (78.2 kg)  04/26/24 170 lb 2 oz (77.2 kg)  03/31/24 168 lb (76.2 kg)    Physical Exam Vitals and nursing note reviewed.  Constitutional:  General: She is not in acute distress.    Appearance: Normal appearance. She is well-developed. She is not diaphoretic.     Comments: Well-appearing, comfortable, cooperative  HENT:     Head: Normocephalic and atraumatic.  Eyes:     General:        Right eye: No discharge.        Left eye: No discharge.     Conjunctiva/sclera: Conjunctivae normal.  Cardiovascular:     Rate and Rhythm: Normal rate.  Pulmonary:     Effort: Pulmonary effort is normal.  Skin:    General: Skin is warm and dry.     Findings: No erythema or rash.  Neurological:     Mental Status: She is alert and oriented to person, place, and time.  Psychiatric:        Mood and Affect: Mood normal.        Behavior: Behavior normal.        Thought Content: Thought content normal.     Comments: Well groomed, good eye contact, normal speech and thoughts     Results for orders placed or performed in visit on 10/11/24  POCT HgB A1C   Collection Time: 10/11/24 10:36 AM  Result Value Ref Range   Hemoglobin A1C 6.1 (A) 4.0 - 5.6 %   HbA1c POC (<> result, manual entry)     HbA1c, POC (prediabetic range)     HbA1c, POC  (controlled diabetic range)        Assessment & Plan:   Problem List Items Addressed This Visit     Essential hypertension   Hyperlipidemia associated with type 2 diabetes mellitus (HCC)   Type 2 diabetes mellitus with other specified complication (HCC) - Primary   Relevant Orders   POCT HgB A1C (Completed)   Other Visit Diagnoses       Obesity (BMI 30.0-34.9)            Type 2 diabetes mellitus with other specified complication Hyperlipidemia Improved glycemic control.  Hemoglobin A1c decreased from 6.5% to 6.1%. - Not on medication management for Diabetes - On Rosuvastatin  for Hyperlipidemia - Continue current dietary management focusing on low carbohydrate, low starch, and low sugar intake. - Scheduled urine check to monitor kidney function with next blood panel in July.  Essential hypertension Current blood pressure reading of 152/88 mmHg, elevated. Attributed to stressors and rushing to appointment. Blood pressure readings at home are reportedly better. Elevated in office.  - Continue current antihypertensive medication regimen = Valsartan -hydrochlorothiazide  320-25mg  daily - Consider addition of 3rd agent, Amlodipine as next option. Defer for now. - Monitor blood pressure at home. - Scheduled follow-up appointment in six months for blood and urine tests.     Hyperlipidemia Secondary to Diabetes On Rosuvastatin  5mg  intermittently every other day   Obesity BMI >33 Weight stable 168-172 range, some gradual increase Encouraging lifestyle modification  Orders Placed This Encounter  Procedures   POCT HgB A1C    No orders of the defined types were placed in this encounter.   Follow up plan: Return for 6 month fasting lab > 1 week later Annual Physical.  Future labs ordered for 04/04/25  Marsa Officer, DO [facility] 10/11/2024, 10:56 AM     [1]  Social History Tobacco Use   Smoking status: Never    Smokeless tobacco: Never  Vaping Use   Vaping status: Never Used  Substance Use Topics   Alcohol use: Never   Drug  use: Never   "

## 2024-10-11 NOTE — Patient Instructions (Addendum)
 Thank you for coming to the office today.  DUE for FASTING BLOOD WORK (no food or drink after midnight before the lab appointment, only water or coffee without cream/sugar on the morning of)  SCHEDULE "Lab Only" visit in the morning at the clinic for lab draw in 6 MONTHS   - Make sure Lab Only appointment is at about 1 week before your next appointment, so that results will be available  For Lab Results, once available within 2-3 days of blood draw, you can can log in to MyChart online to view your results and a brief explanation. Also, we can discuss results at next follow-up visit.   Please schedule a Follow-up Appointment to: Return for 6 month fasting lab > 1 week later Annual Physical.  If you have any other questions or concerns, please feel free to call the office or send a message through MyChart. You may also schedule an earlier appointment if necessary.  Additionally, you may be receiving a survey about your experience at our office within a few days to 1 week by e-mail or mail. We value your feedback.  Saralyn Pilar, DO [facility], [place]

## 2024-12-02 ENCOUNTER — Ambulatory Visit

## 2024-12-07 ENCOUNTER — Ambulatory Visit

## 2025-04-04 ENCOUNTER — Other Ambulatory Visit

## 2025-04-11 ENCOUNTER — Encounter: Admitting: Family Medicine
